# Patient Record
Sex: Female | Born: 1970 | Race: White | Hispanic: No | Marital: Married | Smoking: Former smoker
Health system: Southern US, Community
[De-identification: ages and names within clinical notes are randomized; demographics above are authoritative.]

## PROBLEM LIST (undated history)

## (undated) DIAGNOSIS — E282 Polycystic ovarian syndrome: Secondary | ICD-10-CM

## (undated) DIAGNOSIS — F32A Depression, unspecified: Secondary | ICD-10-CM

## (undated) DIAGNOSIS — O1490 Unspecified pre-eclampsia, unspecified trimester: Secondary | ICD-10-CM

## (undated) DIAGNOSIS — F329 Major depressive disorder, single episode, unspecified: Secondary | ICD-10-CM

## (undated) DIAGNOSIS — O24414 Gestational diabetes mellitus in pregnancy, insulin controlled: Secondary | ICD-10-CM

## (undated) DIAGNOSIS — Z87442 Personal history of urinary calculi: Secondary | ICD-10-CM

## (undated) DIAGNOSIS — K219 Gastro-esophageal reflux disease without esophagitis: Secondary | ICD-10-CM

## (undated) DIAGNOSIS — N189 Chronic kidney disease, unspecified: Secondary | ICD-10-CM

## (undated) DIAGNOSIS — F419 Anxiety disorder, unspecified: Secondary | ICD-10-CM

## (undated) HISTORY — PX: TUBAL LIGATION: SHX77

## (undated) HISTORY — PX: KNEE SURGERY: SHX244

## (undated) HISTORY — PX: DILATION AND CURETTAGE OF UTERUS: SHX78

## (undated) HISTORY — PX: OTHER SURGICAL HISTORY: SHX169

## (undated) HISTORY — PX: CHOLECYSTECTOMY: SHX55

---

## 1997-11-21 ENCOUNTER — Ambulatory Visit (HOSPITAL_COMMUNITY): Admission: RE | Admit: 1997-11-21 | Discharge: 1997-11-22 | Payer: Self-pay | Admitting: *Deleted

## 1998-06-27 ENCOUNTER — Emergency Department (HOSPITAL_COMMUNITY): Admission: EM | Admit: 1998-06-27 | Discharge: 1998-06-27 | Payer: Self-pay | Admitting: Emergency Medicine

## 2000-02-08 ENCOUNTER — Other Ambulatory Visit: Admission: RE | Admit: 2000-02-08 | Discharge: 2000-02-08 | Payer: Self-pay | Admitting: *Deleted

## 2000-04-11 ENCOUNTER — Other Ambulatory Visit: Admission: RE | Admit: 2000-04-11 | Discharge: 2000-04-11 | Payer: Self-pay | Admitting: *Deleted

## 2000-04-11 ENCOUNTER — Encounter (INDEPENDENT_AMBULATORY_CARE_PROVIDER_SITE_OTHER): Payer: Self-pay | Admitting: Specialist

## 2000-08-08 ENCOUNTER — Other Ambulatory Visit: Admission: RE | Admit: 2000-08-08 | Discharge: 2000-08-08 | Payer: Self-pay | Admitting: *Deleted

## 2001-02-13 ENCOUNTER — Other Ambulatory Visit: Admission: RE | Admit: 2001-02-13 | Discharge: 2001-02-13 | Payer: Self-pay | Admitting: Obstetrics and Gynecology

## 2001-07-09 ENCOUNTER — Other Ambulatory Visit: Admission: RE | Admit: 2001-07-09 | Discharge: 2001-07-09 | Payer: Self-pay | Admitting: Obstetrics and Gynecology

## 2002-01-18 ENCOUNTER — Emergency Department (HOSPITAL_COMMUNITY): Admission: EM | Admit: 2002-01-18 | Discharge: 2002-01-18 | Payer: Self-pay | Admitting: Emergency Medicine

## 2002-05-17 ENCOUNTER — Other Ambulatory Visit: Admission: RE | Admit: 2002-05-17 | Discharge: 2002-05-17 | Payer: Self-pay | Admitting: Obstetrics and Gynecology

## 2002-07-02 ENCOUNTER — Emergency Department (HOSPITAL_COMMUNITY): Admission: EM | Admit: 2002-07-02 | Discharge: 2002-07-02 | Payer: Self-pay | Admitting: Emergency Medicine

## 2002-07-02 ENCOUNTER — Encounter: Payer: Self-pay | Admitting: Emergency Medicine

## 2002-10-21 ENCOUNTER — Other Ambulatory Visit: Admission: RE | Admit: 2002-10-21 | Discharge: 2002-10-21 | Payer: Self-pay | Admitting: Obstetrics and Gynecology

## 2003-02-04 ENCOUNTER — Encounter: Payer: Self-pay | Admitting: Internal Medicine

## 2003-02-04 ENCOUNTER — Ambulatory Visit (HOSPITAL_COMMUNITY): Admission: RE | Admit: 2003-02-04 | Discharge: 2003-02-04 | Payer: Self-pay | Admitting: Internal Medicine

## 2003-06-30 ENCOUNTER — Other Ambulatory Visit: Admission: RE | Admit: 2003-06-30 | Discharge: 2003-06-30 | Payer: Self-pay | Admitting: Obstetrics and Gynecology

## 2003-11-17 ENCOUNTER — Encounter: Admission: RE | Admit: 2003-11-17 | Discharge: 2003-11-17 | Payer: Self-pay | Admitting: Obstetrics and Gynecology

## 2004-07-13 ENCOUNTER — Other Ambulatory Visit: Admission: RE | Admit: 2004-07-13 | Discharge: 2004-07-13 | Payer: Self-pay | Admitting: Obstetrics and Gynecology

## 2005-07-28 ENCOUNTER — Other Ambulatory Visit: Admission: RE | Admit: 2005-07-28 | Discharge: 2005-07-28 | Payer: Self-pay | Admitting: Obstetrics and Gynecology

## 2006-11-27 ENCOUNTER — Ambulatory Visit: Payer: Self-pay | Admitting: Internal Medicine

## 2007-03-06 ENCOUNTER — Ambulatory Visit: Payer: Self-pay | Admitting: Internal Medicine

## 2007-08-13 DIAGNOSIS — N200 Calculus of kidney: Secondary | ICD-10-CM

## 2007-08-13 DIAGNOSIS — K5900 Constipation, unspecified: Secondary | ICD-10-CM | POA: Insufficient documentation

## 2007-08-13 DIAGNOSIS — E282 Polycystic ovarian syndrome: Secondary | ICD-10-CM

## 2007-08-13 DIAGNOSIS — I10 Essential (primary) hypertension: Secondary | ICD-10-CM | POA: Insufficient documentation

## 2008-08-25 ENCOUNTER — Ambulatory Visit (HOSPITAL_COMMUNITY): Admission: RE | Admit: 2008-08-25 | Discharge: 2008-08-25 | Payer: Self-pay | Admitting: Obstetrics and Gynecology

## 2008-08-25 ENCOUNTER — Encounter (INDEPENDENT_AMBULATORY_CARE_PROVIDER_SITE_OTHER): Payer: Self-pay | Admitting: Obstetrics and Gynecology

## 2009-08-26 ENCOUNTER — Ambulatory Visit (HOSPITAL_COMMUNITY): Admission: RE | Admit: 2009-08-26 | Discharge: 2009-08-26 | Payer: Self-pay | Admitting: Obstetrics and Gynecology

## 2009-09-22 ENCOUNTER — Ambulatory Visit (HOSPITAL_COMMUNITY): Admission: RE | Admit: 2009-09-22 | Discharge: 2009-09-22 | Payer: Self-pay | Admitting: Obstetrics and Gynecology

## 2009-10-07 ENCOUNTER — Ambulatory Visit (HOSPITAL_COMMUNITY): Admission: RE | Admit: 2009-10-07 | Discharge: 2009-10-07 | Payer: Self-pay | Admitting: Obstetrics and Gynecology

## 2009-11-20 ENCOUNTER — Encounter: Admission: RE | Admit: 2009-11-20 | Discharge: 2009-11-20 | Payer: Self-pay | Admitting: Obstetrics and Gynecology

## 2010-02-23 ENCOUNTER — Inpatient Hospital Stay (HOSPITAL_COMMUNITY): Admission: AD | Admit: 2010-02-23 | Discharge: 2010-02-23 | Payer: Self-pay | Admitting: Obstetrics and Gynecology

## 2010-03-02 ENCOUNTER — Ambulatory Visit: Payer: Self-pay | Admitting: Nurse Practitioner

## 2010-03-02 ENCOUNTER — Inpatient Hospital Stay (HOSPITAL_COMMUNITY): Admission: AD | Admit: 2010-03-02 | Discharge: 2010-03-08 | Payer: Self-pay | Admitting: Obstetrics and Gynecology

## 2010-03-04 ENCOUNTER — Encounter (INDEPENDENT_AMBULATORY_CARE_PROVIDER_SITE_OTHER): Payer: Self-pay | Admitting: Obstetrics and Gynecology

## 2010-08-11 ENCOUNTER — Other Ambulatory Visit: Payer: Self-pay | Admitting: Obstetrics and Gynecology

## 2010-08-19 LAB — COMPREHENSIVE METABOLIC PANEL WITH GFR
ALT: 13 U/L (ref 0–35)
ALT: 13 U/L (ref 0–35)
ALT: 14 U/L (ref 0–35)
AST: 20 U/L (ref 0–37)
AST: 20 U/L (ref 0–37)
AST: 33 U/L (ref 0–37)
Albumin: 2.2 g/dL — ABNORMAL LOW (ref 3.5–5.2)
Albumin: 2.7 g/dL — ABNORMAL LOW (ref 3.5–5.2)
Albumin: 2.7 g/dL — ABNORMAL LOW (ref 3.5–5.2)
Alkaline Phosphatase: 106 U/L (ref 39–117)
Alkaline Phosphatase: 127 U/L — ABNORMAL HIGH (ref 39–117)
Alkaline Phosphatase: 132 U/L — ABNORMAL HIGH (ref 39–117)
BUN: 10 mg/dL (ref 6–23)
BUN: 6 mg/dL (ref 6–23)
BUN: 9 mg/dL (ref 6–23)
CO2: 20 meq/L (ref 19–32)
CO2: 21 meq/L (ref 19–32)
CO2: 23 meq/L (ref 19–32)
Calcium: 8.3 mg/dL — ABNORMAL LOW (ref 8.4–10.5)
Calcium: 9 mg/dL (ref 8.4–10.5)
Calcium: 9.2 mg/dL (ref 8.4–10.5)
Chloride: 106 meq/L (ref 96–112)
Chloride: 109 meq/L (ref 96–112)
Chloride: 109 meq/L (ref 96–112)
Creatinine, Ser: 0.62 mg/dL (ref 0.4–1.2)
Creatinine, Ser: 0.63 mg/dL (ref 0.4–1.2)
Creatinine, Ser: 0.89 mg/dL (ref 0.4–1.2)
GFR calc non Af Amer: >60 mL/min
GFR calc non Af Amer: >60 mL/min
GFR calc non Af Amer: >60 mL/min
Glucose, Bld: 109 mg/dL — ABNORMAL HIGH (ref 70–99)
Glucose, Bld: 74 mg/dL (ref 70–99)
Glucose, Bld: 82 mg/dL (ref 70–99)
Potassium: 3.7 meq/L (ref 3.5–5.1)
Potassium: 3.8 meq/L (ref 3.5–5.1)
Potassium: 4 meq/L (ref 3.5–5.1)
Sodium: 135 meq/L (ref 135–145)
Sodium: 137 meq/L (ref 135–145)
Sodium: 137 meq/L (ref 135–145)
Total Bilirubin: 0.2 mg/dL — ABNORMAL LOW (ref 0.3–1.2)
Total Bilirubin: 0.3 mg/dL (ref 0.3–1.2)
Total Bilirubin: 0.4 mg/dL (ref 0.3–1.2)
Total Protein: 4.7 g/dL — ABNORMAL LOW (ref 6.0–8.3)
Total Protein: 6.4 g/dL (ref 6.0–8.3)
Total Protein: 6.4 g/dL (ref 6.0–8.3)

## 2010-08-19 LAB — GLUCOSE, CAPILLARY
Glucose-Capillary: 103 mg/dL — ABNORMAL HIGH (ref 70–99)
Glucose-Capillary: 111 mg/dL — ABNORMAL HIGH (ref 70–99)
Glucose-Capillary: 94 mg/dL (ref 70–99)
Glucose-Capillary: 98 mg/dL (ref 70–99)

## 2010-08-19 LAB — CBC
HCT: 32.8 % — ABNORMAL LOW (ref 36.0–46.0)
Hemoglobin: 11.4 g/dL — ABNORMAL LOW (ref 12.0–15.0)
Hemoglobin: 8.4 g/dL — ABNORMAL LOW (ref 12.0–15.0)
MCH: 30.5 pg (ref 26.0–34.0)
MCHC: 34.2 g/dL (ref 30.0–36.0)
MCHC: 34.3 g/dL (ref 30.0–36.0)
MCV: 88.5 fL (ref 78.0–100.0)
MCV: 89.5 fL (ref 78.0–100.0)
MCV: 89.9 fL (ref 78.0–100.0)
Platelets: 270 10*3/uL (ref 150–400)
Platelets: 277 10*3/uL (ref 150–400)
RBC: 2.72 MIL/uL — ABNORMAL LOW (ref 3.87–5.11)
RBC: 3.71 MIL/uL — ABNORMAL LOW (ref 3.87–5.11)
RBC: 3.75 MIL/uL — ABNORMAL LOW (ref 3.87–5.11)
RDW: 15.2 % (ref 11.5–15.5)
RDW: 15.2 % (ref 11.5–15.5)
WBC: 13.1 10*3/uL — ABNORMAL HIGH (ref 4.0–10.5)
WBC: 14.6 10*3/uL — ABNORMAL HIGH (ref 4.0–10.5)
WBC: 20.8 10*3/uL — ABNORMAL HIGH (ref 4.0–10.5)

## 2010-08-19 LAB — URINE MICROSCOPIC-ADD ON

## 2010-08-19 LAB — URINALYSIS, ROUTINE W REFLEX MICROSCOPIC
Bilirubin Urine: NEGATIVE
Glucose, UA: NEGATIVE mg/dL
Ketones, ur: NEGATIVE mg/dL
Specific Gravity, Urine: 1.015 (ref 1.005–1.030)
pH: 7 (ref 5.0–8.0)

## 2010-08-19 LAB — CCBB MATERNAL DONOR DRAW

## 2010-08-19 LAB — PROTEIN, URINE, 24 HOUR: Protein, Urine: 14 mg/dL

## 2010-08-19 LAB — SYPHILIS: RPR W/REFLEX TO RPR TITER AND TREPONEMAL ANTIBODIES, TRADITIONAL SCREENING AND DIAGNOSIS ALGORITHM: RPR Ser Ql: NONREACTIVE

## 2010-08-19 LAB — CREATININE CLEARANCE, URINE, 24 HOUR: Creatinine, 24H Ur: 1528 mg/d (ref 700–1800)

## 2010-08-19 LAB — URIC ACID: Uric Acid, Serum: 4.6 mg/dL (ref 2.4–7.0)

## 2010-09-16 LAB — CBC
HCT: 43.9 % (ref 36.0–46.0)
Hemoglobin: 14.9 g/dL (ref 12.0–15.0)
MCV: 92.4 fL (ref 78.0–100.0)
RDW: 12.8 % (ref 11.5–15.5)

## 2010-10-19 NOTE — Assessment & Plan Note (Signed)
[facility]                         GASTROENTEROLOGY OFFICE NOTE   SERAPHINE, GUDIEL                       MRN:          [id number]  DATE:11/27/2006                            DOB:          [birth year]    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  She is here today because of increasing  constipation.  Her constipation started when she went on a weight  reduction diet in January of this year.  Prior to that, she had a bowel  movement every 3-4 days which she considered normal for her.  Since she  has been on a diet, she has lost about 70 pounds and has bowel movements  every 7-9 days.  Prior to having a bowel movement, she usually develops  crampy abdominal pain, alot of discomfort resulting in a lot of forceful  bowel movements ending in diarrhea.  This seems to interfere with her  work.  Her eating habits consist of high fiber, low fat and low  carbohydrate diet.  It seems very reasonable what she eats, egg whites,  bananas, protein supplements for snacks, vegetable cup for lunch and  supper.  She has been losing about 10 pounds weekly, and her goal weight  loss is down to a 175 pounds, currently being 202 pounds.  She feels  physically well, and she exercises several times a week, mostly walking,  and she also started running.  She denies any upper GI symptoms of  heartburn, nausea, vomiting.   MEDICATIONS:  1. BC birth control pills.  2. Valtrex 500 mg p.o. daily.  3. Spironolactone 100 mg daily.  4. Metformin 2000 mg daily.  5. __________  5 mg one p.o. daily.  6. Zymar eye drops.  7. Tums p.r.n.   PAST HISTORY:  1. Kidney stones.  2. High blood pressure.  3. Cholecystectomy in 1998 for cholelithiasis.  4. Arthroscopic surgery of the knee in 1997.   FAMILY HISTORY:  Negative for colon cancer or inflammatory bowel  disease.  Positive for heart disease in father and  grandparents.  Diabetes in father and grandparents.  Alcoholism is paternal  grandfather.  Ovarian cancer in maternal grandmother and breast cancer  in paternal grandmother.   SOCIAL HISTORY:  She is single.  Four years of college.  She lives with  her boyfriend and works as a Database administrator.  She does not  smoke and drinks alcohol only socially.   REVIEW OF SYSTEMS:  Positive for eyeglasses, allergies, sleeping  problems, pain with periods.   PHYSICAL EXAMINATION:  VITAL SIGNS:  Blood pressure 104/74, pulse 64,  weight 202 pounds.  GENERAL:  She was alert, oriented, healthy appearing.  LUNGS:  Clear to auscultation.  COR:  Normal S1, normal S2.  ABDOMEN:  Soft with normoactive bowel sounds.  Post cholecystectomy  laparoscopic scars.  Liver edge at costal margin.  No fullness in the  colon.  RECTAL:  Normal rectal tone.  There was no stool in the rectal vault.  Mucus was Hemoccult negative.   IMPRESSION:  A 40 year old white female with functional constipation  precipitated by a change in the diet resulting in massive weight loss  which was all intentional.  Although she thinks she eats a lot of fiber,  it is my impression that it probably does not add up to 20 gm a day.  She has already tried fiber supplements and she is reluctant to use that  as the only stimulant for her bowel movements.   PLAN:  1. High fiber diet.  Outline given to the patient to educate herself      about content of fiber in each food.  2. Stool softener, Colace, 1-2 a day.  3. MiraLax 8.5 to 17 gm daily to produce bowel movements every other      day or so.  4. I would like to see her again in 3 months to see whether we have      improved her bowel movements.  By that time, she ought to be close      to her goal weight of 175 pounds.     Hedwig Morton. Juanda Chance, MD  Electronically Signed    DMB/MedQ  DD: 11/27/2006  DT: 11/27/2006  Job #: [id number]   cc:   Georgina Quint. Plotnikov, MD

## 2010-10-19 NOTE — Assessment & Plan Note (Signed)
[facility]                         GASTROENTEROLOGY OFFICE NOTE   Emma George, Emma George                     MRN:          [id number]  DATE:03/06/2007                            DOB:          [birth year]    [name] is a very nice 40 year old white female with polycystic ovary  syndrome who takes metformin for her disease.  We have seen her in June  2008 for constipation.  This was a functional constipation related to  change in the diet and intentional weight loss.  By increasing her fiber  on a daily basis and taking Colace and Miralax, she has completely  corrected the problem with constipation.  She has been able to decrease  her Miralax to 2-3 times a week while continuing on Colace 100 mg a day.  She also added Fiberone cereal in the morning.  As far as her other GI  complaints or concerns, she has developed a lumpy feeling in the back of  her throat which comes and goes.  It is not associated with dysphagia or  odynophagia.  She has occasional heartburn.  She has not tried any acid-  suppressing agents.   PHYSICAL EXAM:  Blood pressure 118/70, pulse 72, weight 205 pounds.  She  has not lost any weight since last appointment in June.  The patient was  not reexamined today.   IMPRESSION:  87. A 40 year old white female with functional constipation currently      under good control with stool softener and high fiber diet as well      as low dose Miralax.  2. Lump in the throat.  Symptoms such as this are typical of      gastroesophageal reflux versus allergies.   PLAN:  Prilosec 20 mg daily.  Will be e-prescribing to [facility] on  [address] at [employer].  She will try it for 4-6 weeks.  If it  does not work, she may need to see a ear/nose/throat specialist to  evaluate her possible allergies or throat problems.  If the Prilosec  helps, I have her 3 refills to continue on p.r.n. basis.     Hedwig Morton. Juanda Chance, MD  Electronically  Signed    DMB/MedQ  DD: 03/06/2007  DT: 03/06/2007  Job #: [id number]   cc:   Georgina Quint. Plotnikov, MD

## 2010-10-19 NOTE — Op Note (Signed)
Emma George, Emma George             ACCOUNT NO.:  [id number]   MEDICAL RECORD NO.:  [id number]          PATIENT TYPE:  AMB   LOCATION:  SDC                           FACILITY:  [facility]   PHYSICIAN:  Randye Lobo, M.D.   DATE OF BIRTH:  [birth year]   DATE OF PROCEDURE:  08/25/2008  DATE OF DISCHARGE:                               OPERATIVE REPORT   PREOPERATIVE DIAGNOSIS:  Missed abortion.   POSTOPERATIVE DIAGNOSIS:  Missed abortion.   PROCEDURE:  Dilation and evacuation.   SURGEON:  Randye Lobo, MD   ANESTHESIA:  MAC, local with 1% lidocaine.   ESTIMATED BLOOD LOSS:  Minimal.   URINE OUTPUT:  By I and O catheterization.   COMPLICATIONS:  None.   INDICATIONS FOR PROCEDURE:  The patient is a 40 year old gravida 1, para  33 Caucasian female who presents with vaginal bleeding and spotting.  The  patient has a known diagnosis of an intrauterine gestation at  approximately [redacted] weeks gestation.  The patient has been seen for an  initial ultrasound in the office at which time she had a viable fetus  with a fetal heart rate of 95 beats per minute.  The patient developed  bleeding and spotting, and was seen in followup for repeat ultrasound  and at that time, a missed abortion was diagnosed.  The patient now  presents for dilation and evacuation after risks, benefits, and  alternatives are reviewed.   FINDINGS:  Examination under anesthesia revealed a 7-week size  anteverted mobile uterus.  No adnexal masses were appreciated.   A small amount of products of conception were obtained.   PROCEDURE:  The patient was reidentified in the preoperative hold area.  She did receive Ancef 1 g IV for antibiotic prophylaxis.   In the operating room, MAC anesthesia was induced.  The patient was  placed in the dorsal lithotomy position.  The lower abdomen, vagina, and  perineum were sterilely prepped and draped, and the bladder was  catheterized of urine with a red rubber catheter.   An  examination under anesthesia was performed.  A speculum was placed  inside the vagina and 1% lidocaine was used to inject at the 12 o'clock  position.  A single-tooth tenaculum was then applied.  The paracervical  block was then performed in standard fashion using the 1% lidocaine.   The uterus was sounded at this point to 7 cm.  The cervix was then  dilated to a #21 Pratt dilator.  A #7 suction tip curette was then  introduced through the cervical os to the level of the uterine fundus  and withdrawn slightly.  Proper suction was applied and the suction tube  was turned in a clockwise fashion as it was removed from within the  uterine cavity.  This was repeated an additional time.  Products of  conception were noted.  A sharp curettage with a serrated curette was  then performed gently in all four quadrants.  There were no remaining  products of conception appreciated.  The suction tip curette was  introduced one final time to remove any remaining blood  clots from  within the uterine cavity.   Vaginal instruments were removed as hemostasis was noted to be good.   There were no complications.  All needle, instrument, and sponge counts  were correct.      Randye Lobo, M.D.  Electronically Signed     BES/MEDQ  D:  08/25/2008  T:  08/26/2008  Job:  [id number]

## 2012-01-10 ENCOUNTER — Other Ambulatory Visit: Payer: Self-pay

## 2012-02-23 ENCOUNTER — Ambulatory Visit (HOSPITAL_COMMUNITY)
Admission: RE | Admit: 2012-02-23 | Discharge: 2012-02-23 | Disposition: A | Discharge status: Home / Self Care | Payer: 59 | Source: Ambulatory Visit | Attending: Obstetrics and Gynecology | Admitting: Obstetrics and Gynecology

## 2012-02-23 ENCOUNTER — Ambulatory Visit (HOSPITAL_COMMUNITY): Admission: RE | Admit: 2012-02-23 | Payer: 59 | Source: Ambulatory Visit

## 2012-02-23 ENCOUNTER — Encounter: Payer: 59 | Attending: Obstetrics and Gynecology | Admitting: Dietician

## 2012-02-23 ENCOUNTER — Other Ambulatory Visit: Payer: Self-pay | Admitting: Obstetrics and Gynecology

## 2012-02-23 DIAGNOSIS — Z713 Dietary counseling and surveillance: Secondary | ICD-10-CM | POA: Insufficient documentation

## 2012-02-23 DIAGNOSIS — O99810 Abnormal glucose complicating pregnancy: Secondary | ICD-10-CM | POA: Insufficient documentation

## 2012-02-23 NOTE — ED Notes (Signed)
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  Last pregnancy was 3 years ago following under going fertility treatments.  This pregnancy was not planned.  Ht: 66 in, WT: 258 lb, EDD: 08/27/2012.  Currently at 13 weeks.  Completed review of the diet and recommendations for self-care with a carbohydrate restricted diet.  She has a glucose mete and is not consistently monitoring as she has no strips.  She has an appointment to see Dr. Lucianne Muss for an endocrinologist assessment.  He has in the past ordered her strips.  Provided her with a One Touch Ultra Mini Meter; Lot [id number] X Exp 06/2012.  Instructed to check fasting and 2 hour post-meal blood glucose levels.  On return demonstration, her blood glucose 40 minutes after lucch was 156 mg.  Instructed to take meter and blood glucose log to all clinic appointments.  She is to follow-up with me if she does not see the CDE in Dr. Remus Blake office on a regular basis.  She has my card

## 2012-03-26 ENCOUNTER — Other Ambulatory Visit (HOSPITAL_COMMUNITY): Payer: Self-pay | Admitting: Obstetrics and Gynecology

## 2012-03-26 ENCOUNTER — Ambulatory Visit (HOSPITAL_COMMUNITY)
Admission: RE | Admit: 2012-03-26 | Discharge: 2012-03-26 | Disposition: A | Discharge status: Home / Self Care | Payer: 59 | Source: Ambulatory Visit | Attending: Obstetrics and Gynecology | Admitting: Obstetrics and Gynecology

## 2012-03-26 ENCOUNTER — Encounter (HOSPITAL_COMMUNITY): Payer: Self-pay

## 2012-03-26 VITALS — BP 125/66 | Temp 81.0°F | Wt 253.5 lb

## 2012-03-26 DIAGNOSIS — O24419 Gestational diabetes mellitus in pregnancy, unspecified control: Secondary | ICD-10-CM

## 2012-03-26 DIAGNOSIS — Z1389 Encounter for screening for other disorder: Secondary | ICD-10-CM | POA: Insufficient documentation

## 2012-03-26 DIAGNOSIS — O358XX0 Maternal care for other (suspected) fetal abnormality and damage, not applicable or unspecified: Secondary | ICD-10-CM | POA: Insufficient documentation

## 2012-03-26 DIAGNOSIS — Z363 Encounter for antenatal screening for malformations: Secondary | ICD-10-CM | POA: Insufficient documentation

## 2012-03-26 DIAGNOSIS — O3410 Maternal care for benign tumor of corpus uteri, unspecified trimester: Secondary | ICD-10-CM | POA: Insufficient documentation

## 2012-03-26 DIAGNOSIS — O24919 Unspecified diabetes mellitus in pregnancy, unspecified trimester: Secondary | ICD-10-CM | POA: Insufficient documentation

## 2012-03-26 HISTORY — DX: Polycystic ovarian syndrome: E28.2

## 2012-03-26 NOTE — Progress Notes (Signed)
Emma George  was seen today for an ultrasound appointment.  See full report in [facility].  Alpha Gula, MD  Single IUP at 18 0/7 weeks Pre-gestational diabetes not on medications currently Normal detailed fetal anatomy; however, somewhat limited views of the spine were obtained due to fetal position No markers associated with aneuploidy noted Normal amniotic fluid volume  Recommend follow up ultrasound in 4 weeks to reevaluate the fetal spine / growth.

## 2012-04-23 ENCOUNTER — Ambulatory Visit (HOSPITAL_COMMUNITY)
Admission: RE | Admit: 2012-04-23 | Discharge: 2012-04-23 | Disposition: A | Discharge status: Home / Self Care | Payer: 59 | Source: Ambulatory Visit | Attending: Obstetrics and Gynecology | Admitting: Obstetrics and Gynecology

## 2012-04-23 VITALS — BP 133/73 | HR 92 | Wt 257.0 lb

## 2012-04-23 DIAGNOSIS — I10 Essential (primary) hypertension: Secondary | ICD-10-CM

## 2012-04-23 DIAGNOSIS — O24919 Unspecified diabetes mellitus in pregnancy, unspecified trimester: Secondary | ICD-10-CM | POA: Insufficient documentation

## 2012-04-23 DIAGNOSIS — O24419 Gestational diabetes mellitus in pregnancy, unspecified control: Secondary | ICD-10-CM

## 2012-04-23 DIAGNOSIS — Z3689 Encounter for other specified antenatal screening: Secondary | ICD-10-CM | POA: Insufficient documentation

## 2012-04-23 DIAGNOSIS — O3410 Maternal care for benign tumor of corpus uteri, unspecified trimester: Secondary | ICD-10-CM | POA: Insufficient documentation

## 2012-04-23 NOTE — Progress Notes (Signed)
Emma George  was seen today for an ultrasound appointment.  See full report in [facility].  Single IUP at 22 0/7 weeks Pre-gestational diabetes - recently started on glyburide Normal interval anatomy - the heart and spine were well visualized on today's study Interval fetal growth is appropriate (56th %tile) Normal amniotic fluid volume  Will schedule fetal echo due to pregestational diabetes Follow up growth scan in 6 weeks  Alpha Gula, MD

## 2012-05-02 ENCOUNTER — Encounter: Payer: Self-pay | Admitting: Obstetrics and Gynecology

## 2012-06-04 ENCOUNTER — Ambulatory Visit (HOSPITAL_COMMUNITY)
Admission: RE | Admit: 2012-06-04 | Discharge: 2012-06-04 | Disposition: A | Discharge status: Home / Self Care | Payer: 59 | Source: Ambulatory Visit | Attending: Obstetrics and Gynecology | Admitting: Obstetrics and Gynecology

## 2012-06-04 VITALS — BP 126/73 | HR 98 | Wt 261.0 lb

## 2012-06-04 DIAGNOSIS — E669 Obesity, unspecified: Secondary | ICD-10-CM | POA: Insufficient documentation

## 2012-06-04 DIAGNOSIS — O3410 Maternal care for benign tumor of corpus uteri, unspecified trimester: Secondary | ICD-10-CM | POA: Insufficient documentation

## 2012-06-04 DIAGNOSIS — O24919 Unspecified diabetes mellitus in pregnancy, unspecified trimester: Secondary | ICD-10-CM | POA: Insufficient documentation

## 2012-06-04 DIAGNOSIS — O99210 Obesity complicating pregnancy, unspecified trimester: Secondary | ICD-10-CM | POA: Insufficient documentation

## 2012-06-04 DIAGNOSIS — I10 Essential (primary) hypertension: Secondary | ICD-10-CM

## 2012-06-04 DIAGNOSIS — O24419 Gestational diabetes mellitus in pregnancy, unspecified control: Secondary | ICD-10-CM

## 2012-06-04 NOTE — Progress Notes (Signed)
Emma George  was seen today for an ultrasound appointment.  See full report in [facility].  Impression: Single IUP at 28 0/7 weeks Pregestational diabetes on Gylburide, normal fetal echo by Peds cardiology Normal interval anatomy The estimated fetal weight today is at the 84th %tile; [facility] measures > 97th %tile Normal amniotic fluid volume  Recommendations: Recommend follow up ultrasound for fetal growth in 4 weeks Antepartum fetal testing (2x weekly NSTs) beginning at 32 weeks.  Alpha Gula, MD

## 2012-06-11 ENCOUNTER — Encounter: Payer: 59 | Attending: Obstetrics and Gynecology | Admitting: Dietician

## 2012-06-11 ENCOUNTER — Encounter: Payer: Self-pay | Admitting: Dietician

## 2012-06-11 VITALS — Ht 66.0 in | Wt 262.3 lb

## 2012-06-11 DIAGNOSIS — O24419 Gestational diabetes mellitus in pregnancy, unspecified control: Secondary | ICD-10-CM

## 2012-06-11 DIAGNOSIS — O99810 Abnormal glucose complicating pregnancy: Secondary | ICD-10-CM | POA: Insufficient documentation

## 2012-06-11 DIAGNOSIS — Z713 Dietary counseling and surveillance: Secondary | ICD-10-CM | POA: Insufficient documentation

## 2012-06-11 NOTE — Progress Notes (Signed)
  Medical Nutrition Therapy:  Appt start time: 1400 end time:  1430.   Assessment:  Primary concerns today: Needs insulin instruction.  Presents with history of GDM with her second pregnancy.  Has been on Glyburide 2.5 in the AM and 5 mg AC dinner.  Currently her post meal blood glucose levels are continuing to increase even with close carbohydrate counting.  Post breakfast today was at 155 mg at 2 hours after first bite of breakfast.  Currently monitoring fasting and 2 hr post-meal blood glucose levels. Will teach mixing insulins as she is currently at about 30 weeks in the pregnancy with the potential for even higher glucose levels with the increase in hormones.     MEDICATIONS: Prenatal vitamins, NPH insulin: 10 units at HS   Usual physical activity: Getting in some walking, "but not as much as I should."  Progress Towards Goal(s):  In progress.   Nutritional Diagnosis:  [place]-2.1 Inpaired nutrition utilization As related to blood glucose levels.  As evidenced by diagnosis of GDM, with increasing fasting and 2 hr post meal blood glucose levels..    Intervention:  Nutrition : Completed review of glucose numbers and brief dietary intake.  Provided insulin start kit.  Lot: [id number] EXP: 2016/09/08.  Review of the process of drawing up and mixing NPH and Regular insulin for possible use in the future.  On return demonstration, completed a self-injection using sterile saline.    Handouts given during visit include:  BD Insulin Start Kit.  Monitoring/Evaluation:  Dietary intake, exercise, blood glucose levels, and body weight as neeeded.  She is to call with questions or for other assistance.Marland Kitchen

## 2012-07-04 ENCOUNTER — Ambulatory Visit (HOSPITAL_COMMUNITY)
Admission: RE | Admit: 2012-07-04 | Discharge: 2012-07-04 | Disposition: A | Discharge status: Home / Self Care | Payer: 59 | Source: Ambulatory Visit | Attending: Obstetrics and Gynecology | Admitting: Obstetrics and Gynecology

## 2012-07-04 ENCOUNTER — Encounter (HOSPITAL_COMMUNITY): Payer: Self-pay

## 2012-07-04 VITALS — BP 124/73 | HR 90 | Wt 270.0 lb

## 2012-07-04 DIAGNOSIS — O24919 Unspecified diabetes mellitus in pregnancy, unspecified trimester: Secondary | ICD-10-CM | POA: Insufficient documentation

## 2012-07-04 DIAGNOSIS — O24419 Gestational diabetes mellitus in pregnancy, unspecified control: Secondary | ICD-10-CM

## 2012-07-04 DIAGNOSIS — I10 Essential (primary) hypertension: Secondary | ICD-10-CM

## 2012-07-04 DIAGNOSIS — E669 Obesity, unspecified: Secondary | ICD-10-CM | POA: Insufficient documentation

## 2012-07-04 DIAGNOSIS — O3410 Maternal care for benign tumor of corpus uteri, unspecified trimester: Secondary | ICD-10-CM | POA: Insufficient documentation

## 2012-07-04 NOTE — Progress Notes (Signed)
Emma George  was seen today for an ultrasound appointment.  See full report in [facility].  Impression: Single IUP at 34 1/7 weeks Pregestational diabetes on Gylburide, normal fetal echo by Peds cardiology Normal interval anatomy The estimated fetal weight today is at the 86th %tile; [facility] measures > 97th %tile Normal amniotic fluid volume  Recommendations: Recommend follow up ultrasound for fetal growth in 3- 4 weeks Continue antepartum fetal testing (2x weekly NSTs)  Alpha Gula, MD

## 2012-08-02 ENCOUNTER — Ambulatory Visit (HOSPITAL_COMMUNITY)
Admission: RE | Admit: 2012-08-02 | Discharge: 2012-08-02 | Disposition: A | Discharge status: Home / Self Care | Payer: 59 | Source: Ambulatory Visit | Attending: Obstetrics & Gynecology | Admitting: Obstetrics & Gynecology

## 2012-08-02 ENCOUNTER — Ambulatory Visit (HOSPITAL_COMMUNITY)
Admission: RE | Admit: 2012-08-02 | Discharge: 2012-08-02 | Disposition: A | Discharge status: Home / Self Care | Payer: 59 | Source: Ambulatory Visit | Attending: Obstetrics and Gynecology | Admitting: Obstetrics and Gynecology

## 2012-08-02 DIAGNOSIS — O3410 Maternal care for benign tumor of corpus uteri, unspecified trimester: Secondary | ICD-10-CM | POA: Insufficient documentation

## 2012-08-02 DIAGNOSIS — I10 Essential (primary) hypertension: Secondary | ICD-10-CM

## 2012-08-02 DIAGNOSIS — O99210 Obesity complicating pregnancy, unspecified trimester: Secondary | ICD-10-CM | POA: Insufficient documentation

## 2012-08-02 DIAGNOSIS — O24919 Unspecified diabetes mellitus in pregnancy, unspecified trimester: Secondary | ICD-10-CM | POA: Insufficient documentation

## 2012-08-02 DIAGNOSIS — E669 Obesity, unspecified: Secondary | ICD-10-CM | POA: Insufficient documentation

## 2012-08-02 DIAGNOSIS — O24419 Gestational diabetes mellitus in pregnancy, unspecified control: Secondary | ICD-10-CM

## 2012-08-07 ENCOUNTER — Encounter (HOSPITAL_COMMUNITY): Payer: Self-pay | Admitting: Pharmacist

## 2012-08-09 ENCOUNTER — Inpatient Hospital Stay (HOSPITAL_COMMUNITY)
Admission: AD | Admit: 2012-08-09 | Discharge: 2012-08-09 | Disposition: A | Discharge status: Home / Self Care | Payer: 59 | Source: Ambulatory Visit | Attending: Obstetrics and Gynecology | Admitting: Obstetrics and Gynecology

## 2012-08-09 ENCOUNTER — Encounter (HOSPITAL_COMMUNITY): Payer: Self-pay

## 2012-08-09 DIAGNOSIS — O139 Gestational [pregnancy-induced] hypertension without significant proteinuria, unspecified trimester: Secondary | ICD-10-CM

## 2012-08-09 DIAGNOSIS — O133 Gestational [pregnancy-induced] hypertension without significant proteinuria, third trimester: Secondary | ICD-10-CM

## 2012-08-09 HISTORY — DX: Gestational diabetes mellitus in pregnancy, insulin controlled: O24.414

## 2012-08-09 LAB — COMPREHENSIVE METABOLIC PANEL
BUN: 10 mg/dL (ref 6–23)
CO2: 22 mEq/L (ref 19–32)
Chloride: 104 mEq/L (ref 96–112)
Creatinine, Ser: 0.82 mg/dL (ref 0.50–1.10)
GFR calc non Af Amer: 89 mL/min — ABNORMAL LOW (ref >90)
Total Bilirubin: 0.2 mg/dL — ABNORMAL LOW (ref 0.3–1.2)

## 2012-08-09 LAB — LACTATE DEHYDROGENASE: LDH: 115 U/L (ref 94–250)

## 2012-08-09 LAB — CBC
HCT: 33.7 % — ABNORMAL LOW (ref 36.0–46.0)
MCV: 86.6 fL (ref 78.0–100.0)
RBC: 3.89 MIL/uL (ref 3.87–5.11)
RDW: 13.8 % (ref 11.5–15.5)
WBC: 10.7 10*3/uL — ABNORMAL HIGH (ref 4.0–10.5)

## 2012-08-09 LAB — URINE MICROSCOPIC-ADD ON

## 2012-08-09 LAB — URINALYSIS, ROUTINE W REFLEX MICROSCOPIC
Glucose, UA: NEGATIVE mg/dL
Protein, ur: 30 mg/dL — AB

## 2012-08-09 LAB — URIC ACID: Uric Acid, Serum: 5.3 mg/dL (ref 2.4–7.0)

## 2012-08-09 NOTE — MAU Provider Note (Signed)
History     CSN: [id number]  Arrival date and time: 08/09/12 1127   First Provider Initiated Contact with Patient 08/09/12 1218      Chief Complaint  Patient presents with  . Hypertension   HPI This is a 42 y.o. female at [redacted]w[redacted]d who was sent from the office for [facility] evaluation. She had elevated Bp in office with trace protein.  RN Note: Patient is sent from the office due to elevated bp and trace of protein in urine. Patient denies any pain, headache, visual disturbance, epigastric pain, contractions, vaginal bleeding or lof. Patient reports good fetal movement. Patient have 2-3+ bilateral lower extremities edema, 2+ reflex and 1 beat of clonus on right foot.       OB History   Grav Para Term Preterm Abortions TAB SAB Ect Mult Living   3 1 1  0 1 0 1 0 0 1      Past Medical History  Diagnosis Date  . PCOS (polycystic ovarian syndrome)   . Gestational diabetes requiring insulin     Past Surgical History  Procedure Laterality Date  . Cesarean section      History reviewed. No pertinent family history.  History  Substance Use Topics  . Smoking status: Never Smoker   . Smokeless tobacco: Not on file  . Alcohol Use: No    Allergies:  Allergies  Allergen Reactions  . Codeine     Skin crawling    Prescriptions prior to admission  Medication Sig Dispense Refill  . Dihydroxyaluminum Sod Carb (ROLAIDS PO) Take 15 mLs by mouth 2 (two) times daily as needed (for heartburn).      . Prenatal Vit-Fe Fumarate-FA (PRENATAL MULTIVITAMIN) TABS Take 1 tablet by mouth at bedtime.      . insulin NPH (HUMULIN N,NOVOLIN N) 100 UNIT/ML injection Inject 12-20 Units into the skin 2 (two) times daily. 12 units in the morning and 20 units at night      . ONE TOUCH ULTRA TEST test strip         Review of Systems  Constitutional: Negative for fever, chills and malaise/fatigue.  Eyes: Negative for blurred vision and double vision.  Respiratory: Negative for cough.   Cardiovascular:  Negative for chest pain.  Gastrointestinal: Negative for nausea, vomiting, abdominal pain, diarrhea and constipation.  Genitourinary: Negative for dysuria.  Neurological: Negative for dizziness, sensory change, focal weakness, weakness and headaches.   Physical Exam   Blood pressure 138/75, pulse 68, temperature 97.4 F (36.3 C), temperature source Oral, resp. rate 18. Filed Vitals:   08/09/12 1306 08/09/12 1321 08/09/12 1336 08/09/12 1351  BP: 138/75 126/67 140/75 141/77  Pulse: 68 58 58 63  Temp:      TempSrc:      Resp:    18    Physical Exam  Constitutional: She is oriented to person, place, and time. She appears well-developed and well-nourished. No distress.  HENT:  Head: Normocephalic.  Cardiovascular: Normal rate.   Respiratory: Effort normal.  GI: Soft. She exhibits no distension. There is no tenderness. There is no rebound.  Musculoskeletal: Normal range of motion. She exhibits edema (trace to 1+).  Neurological: She is alert and oriented to person, place, and time. She has normal reflexes.  Skin: Skin is warm and dry.  Psychiatric: She has a normal mood and affect.    MAU Course  Procedures  MDM Per Dr Henderson Cloud, discharge home. Needs BP check tomorrow afternoon.  If <160/100, may wait until scheduled C/S  Assessment and Plan  A:  SIUP at [redacted]w[redacted]d       Labile gestational hypertension  P:  Discharge home as above      Strict PIH precautions reviewed  [facility] 08/09/2012, 2:20 PM

## 2012-08-09 NOTE — Progress Notes (Signed)
Hilda Lias williams cnm notified of patient, bp reading, hyperreflexes.

## 2012-08-09 NOTE — MAU Note (Signed)
Patient is sent from the office due to elevated bp and trace of protein in urine. Patient denies any pain, headache, visual disturbance, epigastric pain, contractions, vaginal bleeding or lof. Patient reports good fetal movement. Patient have 2-3+ bilateral lower extremities edema, 2+ reflex and 1 beat of clonus on right foot.

## 2012-08-13 ENCOUNTER — Inpatient Hospital Stay (HOSPITAL_COMMUNITY)
Admission: AD | Admit: 2012-08-13 | Discharge: 2012-08-16 | DRG: 765 | Disposition: A | Discharge status: Home / Self Care | Payer: 59 | Source: Ambulatory Visit | Attending: Obstetrics and Gynecology | Admitting: Obstetrics and Gynecology

## 2012-08-13 ENCOUNTER — Encounter (HOSPITAL_COMMUNITY): Payer: Self-pay | Admitting: Anesthesiology

## 2012-08-13 ENCOUNTER — Encounter (HOSPITAL_COMMUNITY)
Admission: AD | Disposition: A | Discharge status: Home / Self Care | Payer: Self-pay | Source: Ambulatory Visit | Attending: Obstetrics and Gynecology

## 2012-08-13 ENCOUNTER — Encounter (HOSPITAL_COMMUNITY): Payer: Self-pay | Admitting: *Deleted

## 2012-08-13 ENCOUNTER — Inpatient Hospital Stay (HOSPITAL_COMMUNITY): Payer: 59 | Admitting: Anesthesiology

## 2012-08-13 DIAGNOSIS — O34219 Maternal care for unspecified type scar from previous cesarean delivery: Secondary | ICD-10-CM | POA: Diagnosis present

## 2012-08-13 DIAGNOSIS — Z302 Encounter for sterilization: Secondary | ICD-10-CM

## 2012-08-13 DIAGNOSIS — O99814 Abnormal glucose complicating childbirth: Principal | ICD-10-CM | POA: Diagnosis present

## 2012-08-13 DIAGNOSIS — O09529 Supervision of elderly multigravida, unspecified trimester: Secondary | ICD-10-CM | POA: Diagnosis present

## 2012-08-13 DIAGNOSIS — O10913 Unspecified pre-existing hypertension complicating pregnancy, third trimester: Secondary | ICD-10-CM

## 2012-08-13 DIAGNOSIS — O139 Gestational [pregnancy-induced] hypertension without significant proteinuria, unspecified trimester: Secondary | ICD-10-CM | POA: Diagnosis present

## 2012-08-13 DIAGNOSIS — E669 Obesity, unspecified: Secondary | ICD-10-CM | POA: Diagnosis present

## 2012-08-13 HISTORY — DX: Chronic kidney disease, unspecified: N18.9

## 2012-08-13 HISTORY — DX: Gastro-esophageal reflux disease without esophagitis: K21.9

## 2012-08-13 LAB — CBC
MCH: 28.4 pg (ref 26.0–34.0)
MCHC: 32.9 g/dL (ref 30.0–36.0)
Platelets: 224 10*3/uL (ref 150–400)
RBC: 4.05 MIL/uL (ref 3.87–5.11)

## 2012-08-13 LAB — COMPREHENSIVE METABOLIC PANEL
ALT: 10 U/L (ref 0–35)
AST: 20 U/L (ref 0–37)
CO2: 23 mEq/L (ref 19–32)
Calcium: 8.7 mg/dL (ref 8.4–10.5)
Sodium: 135 mEq/L (ref 135–145)
Total Protein: 6.5 g/dL (ref 6.0–8.3)

## 2012-08-13 LAB — GLUCOSE, CAPILLARY

## 2012-08-13 LAB — PREPARE RBC (CROSSMATCH)

## 2012-08-13 SURGERY — Surgical Case
Anesthesia: Spinal | Site: Abdomen | Laterality: Bilateral | Wound class: Clean Contaminated

## 2012-08-13 MED ORDER — FAMOTIDINE IN NACL 20-0.9 MG/50ML-% IV SOLN
20.0000 mg | Freq: Once | INTRAVENOUS | Status: AC
  Administered 2012-08-13: 20 mg via INTRAVENOUS
  Filled 2012-08-13: qty 50

## 2012-08-13 MED ORDER — MEPERIDINE HCL 25 MG/ML IJ SOLN: 6.2500 mg | INTRAMUSCULAR | Status: DC | PRN

## 2012-08-13 MED ORDER — SCOPOLAMINE 1 MG/3DAYS TD PT72
1.0000 | MEDICATED_PATCH | Freq: Once | TRANSDERMAL | Status: DC
  Administered 2012-08-14: 1.5 mg via TRANSDERMAL

## 2012-08-13 MED ORDER — SODIUM CHLORIDE 0.9 % IR SOLN
Status: DC | PRN
  Administered 2012-08-13: 1000 mL

## 2012-08-13 MED ORDER — LACTATED RINGERS IV SOLN
INTRAVENOUS | Status: DC
  Administered 2012-08-13: 18:00:00 via INTRAVENOUS

## 2012-08-13 MED ORDER — LACTATED RINGERS IV SOLN
INTRAVENOUS | Status: DC | PRN
  Administered 2012-08-13 (×2): via INTRAVENOUS

## 2012-08-13 MED ORDER — CEFAZOLIN SODIUM 10 G IJ SOLR
3.0000 g | INTRAMUSCULAR | Status: AC
  Administered 2012-08-13: 3 g via INTRAVENOUS
  Filled 2012-08-13: qty 3000

## 2012-08-13 MED ORDER — KETOROLAC TROMETHAMINE 30 MG/ML IJ SOLN
30.0000 mg | Freq: Four times a day (QID) | INTRAMUSCULAR | Status: AC | PRN
  Administered 2012-08-14 (×4): 30 mg via INTRAVENOUS
  Filled 2012-08-13 (×3): qty 1

## 2012-08-13 MED ORDER — BUPIVACAINE HCL (PF) 0.25 % IJ SOLN
INTRAMUSCULAR | Status: DC | PRN
  Administered 2012-08-13: 10 mL

## 2012-08-13 MED ORDER — FENTANYL CITRATE 0.05 MG/ML IJ SOLN
INTRAMUSCULAR | Status: AC
  Filled 2012-08-13: qty 2

## 2012-08-13 MED ORDER — MORPHINE SULFATE 0.5 MG/ML IJ SOLN
INTRAMUSCULAR | Status: AC
  Filled 2012-08-13: qty 10

## 2012-08-13 MED ORDER — MORPHINE SULFATE (PF) 0.5 MG/ML IJ SOLN
INTRAMUSCULAR | Status: DC | PRN
  Administered 2012-08-13: .1 mg via INTRATHECAL

## 2012-08-13 MED ORDER — FENTANYL CITRATE 0.05 MG/ML IJ SOLN
INTRAMUSCULAR | Status: DC | PRN
  Administered 2012-08-13: 25 ug via INTRATHECAL
  Administered 2012-08-13: 50 ug via INTRAVENOUS

## 2012-08-13 MED ORDER — CHLOROPROCAINE HCL 3 % IJ SOLN
INTRAMUSCULAR | Status: AC
  Filled 2012-08-13: qty 20

## 2012-08-13 MED ORDER — OXYTOCIN 10 UNIT/ML IJ SOLN
INTRAMUSCULAR | Status: AC
  Filled 2012-08-13: qty 4

## 2012-08-13 MED ORDER — MEPERIDINE HCL 25 MG/ML IJ SOLN
INTRAMUSCULAR | Status: DC | PRN
  Administered 2012-08-13: 12.5 mg via INTRAVENOUS

## 2012-08-13 MED ORDER — ONDANSETRON HCL 4 MG/2ML IJ SOLN
INTRAMUSCULAR | Status: AC
  Filled 2012-08-13: qty 2

## 2012-08-13 MED ORDER — MEPERIDINE HCL 25 MG/ML IJ SOLN
INTRAMUSCULAR | Status: AC
  Filled 2012-08-13: qty 1

## 2012-08-13 MED ORDER — PROMETHAZINE HCL 25 MG/ML IJ SOLN: 6.2500 mg | INTRAMUSCULAR | Status: DC | PRN

## 2012-08-13 MED ORDER — ONDANSETRON HCL 4 MG/2ML IJ SOLN
INTRAMUSCULAR | Status: DC | PRN
  Administered 2012-08-13: 4 mg via INTRAVENOUS

## 2012-08-13 MED ORDER — PHENYLEPHRINE 40 MCG/ML (10ML) SYRINGE FOR IV PUSH (FOR BLOOD PRESSURE SUPPORT)
PREFILLED_SYRINGE | INTRAVENOUS | Status: AC
  Filled 2012-08-13: qty 5

## 2012-08-13 MED ORDER — CITRIC ACID-SODIUM CITRATE 334-500 MG/5ML PO SOLN
30.0000 mL | Freq: Once | ORAL | Status: AC
  Administered 2012-08-13: 30 mL via ORAL
  Filled 2012-08-13: qty 15

## 2012-08-13 MED ORDER — BUPIVACAINE HCL (PF) 0.25 % IJ SOLN
INTRAMUSCULAR | Status: AC
  Filled 2012-08-13: qty 30

## 2012-08-13 MED ORDER — PHENYLEPHRINE HCL 10 MG/ML IJ SOLN
INTRAMUSCULAR | Status: DC | PRN
  Administered 2012-08-13: 80 ug via INTRAVENOUS

## 2012-08-13 MED ORDER — KETOROLAC TROMETHAMINE 30 MG/ML IJ SOLN: 30.0000 mg | Freq: Four times a day (QID) | INTRAMUSCULAR | Status: AC | PRN

## 2012-08-13 MED ORDER — LACTATED RINGERS IV SOLN: INTRAVENOUS | Status: DC

## 2012-08-13 MED ORDER — FENTANYL CITRATE 0.05 MG/ML IJ SOLN
25.0000 ug | INTRAMUSCULAR | Status: DC | PRN
  Administered 2012-08-14: 50 ug via INTRAVENOUS

## 2012-08-13 MED ORDER — MIDAZOLAM HCL 2 MG/2ML IJ SOLN: 0.5000 mg | Freq: Once | INTRAMUSCULAR | Status: AC | PRN

## 2012-08-13 MED ORDER — BUPIVACAINE IN DEXTROSE 0.75-8.25 % IT SOLN
INTRATHECAL | Status: DC | PRN
  Administered 2012-08-13: 1.6 mL via INTRATHECAL

## 2012-08-13 SURGICAL SUPPLY — 33 items
CLIP FILSHIE TUBAL LIGA STRL (Clip) ×2 IMPLANT
CLOTH BEACON ORANGE TIMEOUT ST (SAFETY) ×2 IMPLANT
DERMABOND ADHESIVE PROPEN (GAUZE/BANDAGES/DRESSINGS) ×1
DERMABOND ADVANCED (GAUZE/BANDAGES/DRESSINGS) ×1
DERMABOND ADVANCED .7 DNX12 (GAUZE/BANDAGES/DRESSINGS) ×1 IMPLANT
DERMABOND ADVANCED .7 DNX6 (GAUZE/BANDAGES/DRESSINGS) ×1 IMPLANT
DRAPE LG THREE QUARTER DISP (DRAPES) ×2 IMPLANT
DRSG OPSITE POSTOP 4X10 (GAUZE/BANDAGES/DRESSINGS) ×2 IMPLANT
DURAPREP 26ML APPLICATOR (WOUND CARE) ×2 IMPLANT
ELECT REM PT RETURN 9FT ADLT (ELECTROSURGICAL) ×2
ELECTRODE REM PT RTRN 9FT ADLT (ELECTROSURGICAL) ×1 IMPLANT
EXTRACTOR VACUUM M CUP 4 TUBE (SUCTIONS) IMPLANT
GLOVE BIO SURGEON STRL SZ7 (GLOVE) ×2 IMPLANT
GOWN STRL REIN XL XLG (GOWN DISPOSABLE) ×4 IMPLANT
KIT ABG SYR 3ML LUER SLIP (SYRINGE) IMPLANT
NEEDLE HYPO 25X5/8 SAFETYGLIDE (NEEDLE) IMPLANT
NS IRRIG 1000ML POUR BTL (IV SOLUTION) ×2 IMPLANT
PACK C SECTION WH (CUSTOM PROCEDURE TRAY) ×2 IMPLANT
PAD ABD 7.5X8 STRL (GAUZE/BANDAGES/DRESSINGS) ×2 IMPLANT
PAD OB MATERNITY 4.3X12.25 (PERSONAL CARE ITEMS) ×2 IMPLANT
RTRCTR C-SECT PINK 25CM LRG (MISCELLANEOUS) ×2 IMPLANT
SLEEVE SCD COMPRESS KNEE MED (MISCELLANEOUS) ×2 IMPLANT
STAPLER VISISTAT 35W (STAPLE) IMPLANT
SUT CHROMIC 1 CTX 36 (SUTURE) ×4 IMPLANT
SUT PDS AB 0 CTX 60 (SUTURE) ×2 IMPLANT
SUT PLAIN 2 0 XLH (SUTURE) ×2 IMPLANT
SUT VIC AB 2-0 CT1 27 (SUTURE) ×1
SUT VIC AB 2-0 CT1 TAPERPNT 27 (SUTURE) ×1 IMPLANT
SUT VIC AB 4-0 KS 27 (SUTURE) ×2 IMPLANT
TAPE CLOTH SURG 4X10 WHT LF (GAUZE/BANDAGES/DRESSINGS) ×2 IMPLANT
TOWEL OR 17X24 6PK STRL BLUE (TOWEL DISPOSABLE) ×6 IMPLANT
TRAY FOLEY CATH 14FR (SET/KITS/TRAYS/PACK) ×2 IMPLANT
WATER STERILE IRR 1000ML POUR (IV SOLUTION) ×2 IMPLANT

## 2012-08-13 NOTE — Anesthesia Procedure Notes (Signed)
Spinal  Patient location during procedure: OR Start time: 08/13/2012 10:13 PM Staffing Anesthesiologist: Angus Seller., Harrell Gave. Performed by: anesthesiologist  Preanesthetic Checklist Completed: patient identified, site marked, surgical consent, pre-op evaluation, timeout performed, IV checked, risks and benefits discussed and monitors and equipment checked Spinal Block Patient position: sitting Prep: DuraPrep Patient monitoring: heart rate, cardiac monitor, continuous pulse ox and blood pressure Approach: midline Location: L3-4 Injection technique: single-shot Needle Needle type: Sprotte  Needle gauge: 24 G Needle length: 9 cm Assessment Sensory level: T4 Additional Notes Patient identified.  Risk benefits discussed including failed block, incomplete pain control, headache, nerve damage, paralysis, blood pressure changes, nausea, vomiting, reactions to medication both toxic or allergic, and postpartum back pain.  Patient expressed understanding and wished to proceed.  All questions were answered.  Sterile technique used throughout procedure.  CSF was clear.  No parasthesia or other complications.  Please see nursing notes for vital signs.

## 2012-08-13 NOTE — MAU Provider Note (Signed)
Chief Complaint:  Hypertension   First Maxfield Gildersleeve Initiated Contact with Patient 08/13/12 1822      HPI: Emma George is a 42 y.o. G3P1011 at 21w0dwho presents to maternity admissions sent from office with elevated BP, proteinuria, and h/a.  She reports onset of h/a described as "like a vice wrapped around my head" yesterday, worsening today.  She has not taken anything for her h/a.  She denies epigastric pain or visual disturbances. She reports good fetal movement, denies LOF, vaginal bleeding, vaginal itching/burning, urinary symptoms, dizziness, n/v, or fever/chills.    Her obstetric history is significant for previous C/S and she desires a repeat C/S for this pregnancy along with BTL.  She is also GDM requiring insulin.   Past Medical History: Past Medical History  Diagnosis Date  . PCOS (polycystic ovarian syndrome)   . Gestational diabetes requiring insulin   . GERD (gastroesophageal reflux disease)   . Chronic kidney disease     Past obstetric history: OB History   Grav Para Term Preterm Abortions TAB SAB Ect Mult Living   3 1 1  0 1 0 1 0 0 1     # Outc Date GA Lbr Len/2nd Wgt Sex Del Anes PTL Lv   1 TRM            2 SAB            3 CUR               Past Surgical History: Past Surgical History  Procedure Laterality Date  . Cesarean section    . Dilation and curettage of uterus    . Cholecysectomy    . Knee surgery      Family History: Family History  Problem Relation Age of Onset  . Hypertension Mother   . Diabetes Father   . Heart disease Father   . Hyperlipidemia Father   . Cancer Maternal Grandmother   . Stroke Maternal Grandfather   . Diabetes Paternal Grandmother   . Heart disease Paternal Grandmother   . Cancer Paternal Grandmother   . Diabetes Paternal Grandfather   . Heart disease Paternal Grandfather     Social History: History  Substance Use Topics  . Smoking status: Former Smoker -- 1.00 packs/day for 15 years    Types: Cigarettes   Quit date: 09/17/2003  . Smokeless tobacco: Not on file  . Alcohol Use: No    Allergies:  Allergies  Allergen Reactions  . Codeine     Skin crawling    Meds:  Prescriptions prior to admission  Medication Sig Dispense Refill  . Dihydroxyaluminum Sod Carb (ROLAIDS PO) Take 15 mLs by mouth 2 (two) times daily as needed (for heartburn).      . insulin NPH (HUMULIN N,NOVOLIN N) 100 UNIT/ML injection Inject 12-20 Units into the skin 2 (two) times daily. 12 units in the morning and 20 units at night      . Prenatal Vit-Fe Fumarate-FA (PRENATAL MULTIVITAMIN) TABS Take 1 tablet by mouth at bedtime.      . ONE TOUCH ULTRA TEST test strip         ROS: Pertinent findings in history of present illness.  Physical Exam  Blood pressure 160/83, pulse 76, temperature 97.7 F (36.5 C), temperature source Oral, resp. rate 16, height 5' 5.5" (1.664 m), weight 128.187 kg (282 lb 9.6 oz), SpO2 99.00%. GENERAL: Well-developed, well-nourished female in no acute distress.  HEENT: normocephalic HEART: normal rate RESP: normal effort ABDOMEN: Soft, non-tender,  gravid appropriate for gestational age EXTREMITIES: Nontender, no edema NEURO: alert and oriented SPECULUM EXAM: Deferred     FHT:  Baseline 135 , moderate variability, accelerations present, no decelerations Contractions: occasional, mild to palpation and per pt   Labs: Results for orders placed during the hospital encounter of 08/13/12 (from the past 24 hour(s))  CBC     Status: Abnormal   Collection Time    08/13/12  5:12 PM      Result Value Range   WBC 12.7 (*) 4.0 - 10.5 K/uL   RBC 4.05  3.87 - 5.11 MIL/uL   Hemoglobin 11.5 (*) 12.0 - 15.0 g/dL   HCT 16.1 (*) 09.6 - 04.5 %   MCV 86.4  78.0 - 100.0 fL   MCH 28.4  26.0 - 34.0 pg   MCHC 32.9  30.0 - 36.0 g/dL   RDW 40.9  81.1 - 91.4 %   Platelets 224  150 - 400 K/uL  COMPREHENSIVE METABOLIC PANEL     Status: Abnormal   Collection Time    08/13/12  5:12 PM      Result Value Range    Sodium 135  135 - 145 mEq/L   Potassium 3.9  3.5 - 5.1 mEq/L   Chloride 103  96 - 112 mEq/L   CO2 23  19 - 32 mEq/L   Glucose, Bld 77  70 - 99 mg/dL   BUN 14  6 - 23 mg/dL   Creatinine, Ser 7.82  0.50 - 1.10 mg/dL   Calcium 8.7  8.4 - 95.6 mg/dL   Total Protein 6.5  6.0 - 8.3 g/dL   Albumin 2.5 (*) 3.5 - 5.2 g/dL   AST 20  0 - 37 U/L   ALT 10  0 - 35 U/L   Alkaline Phosphatase 138 (*) 39 - 117 U/L   Total Bilirubin 0.3  0.3 - 1.2 mg/dL   GFR calc non Af Amer 89 (*) >90 mL/min   GFR calc Af Amer >90  >90 mL/min  LACTATE DEHYDROGENASE     Status: None   Collection Time    08/13/12  5:12 PM      Result Value Range   LDH 136  94 - 250 U/L  URIC ACID     Status: None   Collection Time    08/13/12  5:12 PM      Result Value Range   Uric Acid, Serum 5.8  2.4 - 7.0 mg/dL    Assessment: 1. Chronic hypertension in pregnancy, third trimester     Plan: Called Dr Tenny Craw to discuss assessment and findings Plan for C/S tonight 8 hours after last meal Dr Tenny Craw to assume care at this time     Medication List    ASK your doctor about these medications       insulin NPH 100 UNIT/ML injection  Commonly known as:  HUMULIN N,NOVOLIN N  Inject 12-20 Units into the skin 2 (two) times daily. 12 units in the morning and 20 units at night     ONE TOUCH ULTRA TEST test strip  Generic drug:  glucose blood     prenatal multivitamin Tabs  Take 1 tablet by mouth at bedtime.     ROLAIDS PO  Take 15 mLs by mouth 2 (two) times daily as needed (for heartburn).        Sharen Counter Certified Nurse-Midwife 08/13/2012 6:34 PM

## 2012-08-13 NOTE — OR Nursing (Addendum)
Uterus massaged by S. Satterfield Rn. Two tubes of cord blood sent to lab.  20cc of blood evacuated from uterus during uterine massage.  Filshie clips applied to right and left fallopian tubes by Dr. Fredia Sorrow on 08/13/2012. Lot number [id number],expiration date 2016-09,and manufacture [employer].

## 2012-08-13 NOTE — MAU Note (Signed)
Patient sent from the office for evaluation of elevated blood pressure. Patient is scheduled for a repeat cesarean section on 3-14. States she is having headaches. Reports good fetal movement, no contractions or leaking.

## 2012-08-13 NOTE — Anesthesia Preprocedure Evaluation (Deleted)
Anesthesia Evaluation Anesthesia Physical Anesthesia Plan Anesthesia Quick Evaluation  

## 2012-08-13 NOTE — Op Note (Signed)
Pre-Operative Diagnosis: 1) 38 week intrauterine pregnancy 2) A2 gestational diabetes mellitus on insulin 3) Severe gestational hypertension 4) History of prior cesarean section, desires repeat 5) Desired permanent sterilization 6) Maternal obesity Postoperative Diagnosis: Same Procedure: Repeat Cesarean section via Pfannenstiel skin incision with bilateral tubal ligation with filshie clips Surgeon: Dr. Waynard Reeds Assistant: None Operative Findings: Vigorous female infant in vertex presentation with apgars of 9 & 9.  Normal ovaries and tubes.  Thin lower uterine segment. Specimen: Placenta for disposal EBL: Total I/O In: 1600 [I.V.:1600] Out: 750 [Urine:50; Blood:700]   Procedure:Emma George is an 42 year old gravida 3 para 1011 at 33 weeks and 0 days estimated gestational age who presents for cesarean section. The patient is an A2 gestational diabetic currently on insulin. Over the last several weeks of her pregnancy she's been noted to have elevated blood pressures. Today in the office she had a blood pressure of 170/110 with 2+ protein, and a persistent headache. She was sent to maternity admissions for serial blood pressures, preeclampsia labs, and a nonstress test. Lab work was normal, however her blood pressures continued to be elevated in the severe range the decision was made to proceed with repeat cesarean section for severe range blood pressures. She also desired permanent sterilization. The patient was appropriately identified during a time out procedure prior to proceeding with the case. Following the appropriate informed consent the patient was brought to the operating room where spinal anesthesia was administered and found to be adequate. 10 cc of quarter percent Marcaine was injected into the skin at the incision site to to some slight discomfort with the Allis test at the umbilicus. She was placed in the dorsal supine position with a leftward tilt. She was prepped and draped in the normal  sterile fashion. Scalpel was then used to make a Pfannenstiel skin incision which was carried down to the underlying layers of soft tissue to the fascia. The fascia was incised in the midline and the fascial incision was extended laterally with Mayo scissors. The superior aspect of the fascial incision was grasped with Coker clamps x2, tented up and the rectus muscles dissected off sharply with the electrocautery unit area and the same procedure was repeated on the inferior aspect of the fascial incision. The patient did have mild discomfort at this portion of the case and 3% Nesacaine was poured into the surgical site. The rectus muscles were separated in the midline. The abdominal peritoneum was identified, tented up, entered sharply, and the incision was extended superiorly and inferiorly with good visualization of the bladder. The Alexis retractor was then deployed. The vesicouterine peritoneum was identified, tented up, entered sharply, and the bladder flap was created digitally. Scalpel was then used to make a low transverse incision on the uterus which was extended laterally with blunt dissection. The fetal vertex was identified, delivered with the assistance of a vaccuum through the uterine incision followed by the body. The infant was bulb suctioned on the operative field cried vigorously, cord was clamped and cut and the infant was passed to the waiting neonatologist. Placenta was then delivered spontaneously, the uterus was cleared of all clot and debris. The uterine incision was repaired with #1 chromic in running locked fashion. Ovaries and tubes were inspected and normal. The right fallopian tube was grasped with a Babcock clamp and tented up and a clear space was identified in the mesosalpinx. The Filshie clip was applied across the fallopian tube. The same procedure was repeated on the left fallopian tube.The  Alexis retractor was removed. The uterus was returned to the abdominal cavity the abdominal  cavity was cleared of all clot and debris. The abdominal peritoneum was reapproximated with 2-0 Vicryl in a running fashion, the rectus muscles was reapproximated with #1 chromic in a running fashion. The fascia was closed with a looped PDS in a running fashion. The skin was closed with 4-0 vicryl in a subcuticular fashion and Dermabond. All sponge lap and needle counts were correct x2. Patient tolerated the procedure well and recovered in stable condition following the procedure.

## 2012-08-13 NOTE — Transfer of Care (Signed)
Immediate Anesthesia Transfer of Care Note  Patient: Emma George  Procedure(s) Performed: Procedure(s): Repeat cesarean section with delivery of baby boy at  2242. Apgars 9/9.  Bilateral tubal ligation with filshie clips. (Bilateral)  Patient Location: PACU  Anesthesia Type:Spinal  Level of Consciousness: awake and oriented  Airway & Oxygen Therapy: Patient Spontanous Breathing  Post-op Assessment: Report given to PACU RN and Post -op Vital signs reviewed and stable  Post vital signs: Reviewed  Complications: No apparent anesthesia complications

## 2012-08-13 NOTE — Progress Notes (Signed)
Pt has been on NPH insulin 12 units QAM and 20 units QHS No insulin ordered at this time.   CBGs ordered Q4 hrs.  Will add insulin coverage as needed

## 2012-08-13 NOTE — H&P (Signed)
Emma George is a 42 y.o. female presenting for elevated BPs  42 yo G3P1011 @ 38+0 weeks presents from the office for evaluation of elevated blood pressures.  The patient is a A2 gestational diabetic and is now on insulin. Over the last few office visits she has been noticed to have elevated blood pressures.  Last Thursday she was evaluated in MAU for elevated BPs. She was without symptoms at that time.  Labs were normal.  Today she presents c/o 2 day h/o headache.  She has not yet taken any medication for her headaches.  In the office her BP was 170/110 and she had 2+ protein in her urine.  In MAU, her BPs continue to be elevated but her labs are normal.  Given severe range BPs with symptoms will proceed with delivery.  The patient desires repeat cesarean section with tubal ligation for permanent sterilization.  R/B/A reviewed and she wishes to proceed.  History OB History   Grav Para Term Preterm Abortions TAB SAB Ect Mult Living   3 1 1  0 1 0 1 0 0 1     Past Medical History  Diagnosis Date  . PCOS (polycystic ovarian syndrome)   . Gestational diabetes requiring insulin   . GERD (gastroesophageal reflux disease)   . Chronic kidney disease    Past Surgical History  Procedure Laterality Date  . Cesarean section    . Dilation and curettage of uterus    . Cholecysectomy    . Knee surgery     Family History: family history includes Cancer in her maternal grandmother and paternal grandmother; Diabetes in her father, paternal grandfather, and paternal grandmother; Heart disease in her father, paternal grandfather, and paternal grandmother; Hyperlipidemia in her father; Hypertension in her mother; and Stroke in her maternal grandfather. Social History:  reports that she quit smoking about 8 years ago. Her smoking use included Cigarettes. She has a 15 pack-year smoking history. She does not have any smokeless tobacco history on file. She reports that she does not drink alcohol or use illicit  drugs.   Prenatal Transfer Tool  Maternal Diabetes: A2 gestational diabetes on insulin Genetic Screening: normal Maternal Ultrasounds/Referrals: Normal Fetal Ultrasounds or other Referrals:  None Maternal Substance Abuse:  None Significant Maternal Medications:  Insulin Significant Maternal Lab Results:  None Other Comments:  None  ROS: as above    Blood pressure 149/87, pulse 76, temperature 97.7 F (36.5 C), temperature source Oral, resp. rate 16, height 5' 5.5" (1.664 m), weight 128.187 kg (282 lb 9.6 oz), SpO2 99.00%. Exam Physical Exam  Prenatal labs: ABO, Rh:   Antibody:   Rubella:   RPR:    HBsAg:    HIV:    GBS:     Assessment/Plan: 1) Admit 2) Ancef 3gm OCTOR 3) SCDs to OR 4) CBGs Q 4 hrs 5) Concent for repeat C/S with BTL  ROSS,KENDRA H. 08/13/2012, 6:55 PM

## 2012-08-13 NOTE — Anesthesia Preprocedure Evaluation (Signed)
Anesthesia Evaluation  Patient identified by MRN, date of birth, ID band Patient awake    Reviewed: Allergy & Precautions, H&P , NPO status , Patient's Chart, lab work & pertinent test results  Airway Mallampati: III      Dental no notable dental hx.    Pulmonary neg pulmonary ROS,  breath sounds clear to auscultation  Pulmonary exam normal       Cardiovascular Exercise Tolerance: Good hypertension, negative cardio ROS  Rhythm:regular Rate:Normal     Neuro/Psych negative neurological ROS  negative psych ROS   GI/Hepatic negative GI ROS, Neg liver ROS, GERD-  ,  Endo/Other  negative endocrine ROSdiabetesMorbid obesity  Renal/GU Renal diseasenegative Renal ROS  negative genitourinary   Musculoskeletal   Abdominal Normal abdominal exam  (+)   Peds  Hematology negative hematology ROS (+)   Anesthesia Other Findings PCOS (polycystic ovarian syndrome)     Gestational diabetes requiring insulin        GERD (gastroesophageal reflux disease)    Reproductive/Obstetrics (+) Pregnancy                           Anesthesia Physical Anesthesia Plan  ASA: III and emergent  Anesthesia Plan: Spinal   Post-op Pain Management:    Induction:   Airway Management Planned:   Additional Equipment:   Intra-op Plan:   Post-operative Plan:   Informed Consent: I have reviewed the patients History and Physical, chart, labs and discussed the procedure including the risks, benefits and alternatives for the proposed anesthesia with the patient or authorized representative who has indicated his/her understanding and acceptance.     Plan Discussed with: Anesthesiologist, CRNA and Surgeon  Anesthesia Plan Comments:         Anesthesia Quick Evaluation

## 2012-08-14 ENCOUNTER — Encounter (HOSPITAL_COMMUNITY): Payer: Self-pay | Admitting: *Deleted

## 2012-08-14 LAB — CBC
HCT: 31.4 % — ABNORMAL LOW (ref 36.0–46.0)
MCHC: 33.1 g/dL (ref 30.0–36.0)
MCV: 86.5 fL (ref 78.0–100.0)
Platelets: 192 10*3/uL (ref 150–400)
RDW: 14 % (ref 11.5–15.5)

## 2012-08-14 LAB — RPR: RPR Ser Ql: NONREACTIVE

## 2012-08-14 LAB — GLUCOSE, CAPILLARY
Glucose-Capillary: 96 mg/dL (ref 70–99)
Glucose-Capillary: 90 mg/dL (ref 70–99)

## 2012-08-14 MED ORDER — ONDANSETRON HCL 4 MG/2ML IJ SOLN: 4.0000 mg | Freq: Three times a day (TID) | INTRAMUSCULAR | Status: DC | PRN

## 2012-08-14 MED ORDER — DIBUCAINE 1 % RE OINT: 1.0000 "application " | TOPICAL_OINTMENT | RECTAL | Status: DC | PRN

## 2012-08-14 MED ORDER — FENTANYL CITRATE 0.05 MG/ML IJ SOLN
INTRAMUSCULAR | Status: AC
  Filled 2012-08-14: qty 2

## 2012-08-14 MED ORDER — KETOROLAC TROMETHAMINE 30 MG/ML IJ SOLN
INTRAMUSCULAR | Status: AC
  Filled 2012-08-14: qty 1

## 2012-08-14 MED ORDER — SIMETHICONE 80 MG PO CHEW: 80.0000 mg | CHEWABLE_TABLET | ORAL | Status: DC | PRN

## 2012-08-14 MED ORDER — NALOXONE HCL 1 MG/ML IJ SOLN
1.0000 ug/kg/h | INTRAVENOUS | Status: DC | PRN
  Filled 2012-08-14: qty 2

## 2012-08-14 MED ORDER — ZOLPIDEM TARTRATE 5 MG PO TABS: 5.0000 mg | ORAL_TABLET | Freq: Every evening | ORAL | Status: DC | PRN

## 2012-08-14 MED ORDER — ACETAMINOPHEN 10 MG/ML IV SOLN
1000.0000 mg | Freq: Four times a day (QID) | INTRAVENOUS | Status: AC | PRN
  Administered 2012-08-14: 1000 mg via INTRAVENOUS
  Filled 2012-08-14: qty 100

## 2012-08-14 MED ORDER — NALBUPHINE HCL 10 MG/ML IJ SOLN
5.0000 mg | INTRAMUSCULAR | Status: DC | PRN
  Administered 2012-08-14: 10 mg via INTRAVENOUS
  Filled 2012-08-14 (×2): qty 1

## 2012-08-14 MED ORDER — MENTHOL 3 MG MT LOZG: 1.0000 | LOZENGE | OROMUCOSAL | Status: DC | PRN

## 2012-08-14 MED ORDER — DIPHENHYDRAMINE HCL 50 MG/ML IJ SOLN: 12.5000 mg | INTRAMUSCULAR | Status: DC | PRN

## 2012-08-14 MED ORDER — IBUPROFEN 600 MG PO TABS
600.0000 mg | ORAL_TABLET | Freq: Four times a day (QID) | ORAL | Status: DC
  Administered 2012-08-14 – 2012-08-16 (×7): 600 mg via ORAL
  Filled 2012-08-14 (×7): qty 1

## 2012-08-14 MED ORDER — DIPHENHYDRAMINE HCL 25 MG PO CAPS: 25.0000 mg | ORAL_CAPSULE | ORAL | Status: DC | PRN

## 2012-08-14 MED ORDER — LACTATED RINGERS IV SOLN
INTRAVENOUS | Status: DC
  Administered 2012-08-14: 11:00:00 via INTRAVENOUS

## 2012-08-14 MED ORDER — PRENATAL MULTIVITAMIN CH
1.0000 | ORAL_TABLET | Freq: Every day | ORAL | Status: DC
  Administered 2012-08-15 – 2012-08-16 (×2): 1 via ORAL
  Filled 2012-08-14 (×2): qty 1

## 2012-08-14 MED ORDER — WITCH HAZEL-GLYCERIN EX PADS: 1.0000 "application " | MEDICATED_PAD | CUTANEOUS | Status: DC | PRN

## 2012-08-14 MED ORDER — METOCLOPRAMIDE HCL 5 MG/ML IJ SOLN: 10.0000 mg | Freq: Three times a day (TID) | INTRAMUSCULAR | Status: DC | PRN

## 2012-08-14 MED ORDER — NALOXONE HCL 0.4 MG/ML IJ SOLN: 0.4000 mg | INTRAMUSCULAR | Status: DC | PRN

## 2012-08-14 MED ORDER — HYDROMORPHONE HCL 2 MG PO TABS: 2.0000 mg | ORAL_TABLET | ORAL | Status: DC | PRN

## 2012-08-14 MED ORDER — NALBUPHINE HCL 10 MG/ML IJ SOLN
5.0000 mg | INTRAMUSCULAR | Status: DC | PRN
  Filled 2012-08-14: qty 1

## 2012-08-14 MED ORDER — TETANUS-DIPHTH-ACELL PERTUSSIS 5-2.5-18.5 LF-MCG/0.5 IM SUSP
0.5000 mL | Freq: Once | INTRAMUSCULAR | Status: AC
  Administered 2012-08-15: 0.5 mL via INTRAMUSCULAR
  Filled 2012-08-14: qty 0.5

## 2012-08-14 MED ORDER — SODIUM CHLORIDE 0.9 % IJ SOLN: 3.0000 mL | INTRAMUSCULAR | Status: DC | PRN

## 2012-08-14 MED ORDER — OXYTOCIN 40 UNITS IN LACTATED RINGERS INFUSION - SIMPLE MED: 62.5000 mL/h | INTRAVENOUS | Status: AC

## 2012-08-14 MED ORDER — ONDANSETRON HCL 4 MG/2ML IJ SOLN: 4.0000 mg | INTRAMUSCULAR | Status: DC | PRN

## 2012-08-14 MED ORDER — DIPHENHYDRAMINE HCL 25 MG PO CAPS
25.0000 mg | ORAL_CAPSULE | Freq: Four times a day (QID) | ORAL | Status: DC | PRN
  Administered 2012-08-14: 25 mg via ORAL
  Filled 2012-08-14: qty 1

## 2012-08-14 MED ORDER — LABETALOL HCL 5 MG/ML IV SOLN
10.0000 mg | INTRAVENOUS | Status: DC | PRN
  Administered 2012-08-14 (×4): 10 mg via INTRAVENOUS
  Filled 2012-08-14: qty 4

## 2012-08-14 MED ORDER — OXYCODONE-ACETAMINOPHEN 5-325 MG PO TABS: 1.0000 | ORAL_TABLET | ORAL | Status: DC | PRN

## 2012-08-14 MED ORDER — LABETALOL HCL 5 MG/ML IV SOLN
INTRAVENOUS | Status: AC
  Filled 2012-08-14: qty 4

## 2012-08-14 MED ORDER — ONDANSETRON HCL 4 MG PO TABS: 4.0000 mg | ORAL_TABLET | ORAL | Status: DC | PRN

## 2012-08-14 MED ORDER — SENNOSIDES-DOCUSATE SODIUM 8.6-50 MG PO TABS
2.0000 | ORAL_TABLET | Freq: Every day | ORAL | Status: DC
  Administered 2012-08-14 – 2012-08-15 (×2): 2 via ORAL

## 2012-08-14 MED ORDER — SCOPOLAMINE 1 MG/3DAYS TD PT72
MEDICATED_PATCH | TRANSDERMAL | Status: AC
  Filled 2012-08-14: qty 1

## 2012-08-14 MED ORDER — SIMETHICONE 80 MG PO CHEW
80.0000 mg | CHEWABLE_TABLET | Freq: Three times a day (TID) | ORAL | Status: DC
  Administered 2012-08-14 – 2012-08-16 (×8): 80 mg via ORAL

## 2012-08-14 MED ORDER — DIPHENHYDRAMINE HCL 50 MG/ML IJ SOLN: 25.0000 mg | INTRAMUSCULAR | Status: DC | PRN

## 2012-08-14 MED ORDER — LANOLIN HYDROUS EX OINT: 1.0000 "application " | TOPICAL_OINTMENT | CUTANEOUS | Status: DC | PRN

## 2012-08-14 NOTE — Progress Notes (Signed)
Post Op Day 1 Subjective: no complaints  Objective: Blood pressure 130/87, pulse 64, temperature 97.8 F (36.6 C), temperature source Oral, resp. rate 18, height 5' 5.5" (1.664 m), weight 128.187 kg (282 lb 9.6 oz), SpO2 97.00%, breastfeeding.  Physical Exam:  General: alert Lochia: appropriate Uterine Fundus: firm Incision: no significant drainage   Recent Labs  08/13/12 1712 08/14/12 0620  HGB 11.5* 10.4*  HCT 35.0* 31.4*    Assessment/Plan: Continue post op observation.  Check glucose fasting and post prandial.   LOS: 1 day   KAPLAN,RICHARD D 08/14/2012, 8:45 AM

## 2012-08-14 NOTE — Anesthesia Postprocedure Evaluation (Signed)
  Anesthesia Post Note  Patient: Emma George  Procedure(s) Performed: Procedure(s) (LRB): Repeat cesarean section with delivery of baby boy at  2242. Apgars 9/9.  Bilateral tubal ligation with filshie clips. (Bilateral)  Anesthesia type: Spinal  Patient location: PACU  Post pain: Pain level controlled  Post assessment: Post-op Vital signs reviewed  Last Vitals:  Filed Vitals:   08/13/12 2347  BP: 138/121  Pulse: 69  Temp:   Resp: 18    Post vital signs: Reviewed  Level of consciousness: awake  Complications: No apparent anesthesia complications

## 2012-08-14 NOTE — Anesthesia Postprocedure Evaluation (Signed)
  Anesthesia Post-op Note  Patient: Emma George  Procedure(s) Performed: Procedure(s): Repeat cesarean section with delivery of baby boy at  2242. Apgars 9/9.  Bilateral tubal ligation with filshie clips. (Bilateral)  Patient Location: Mother/Baby  Anesthesia Type:Regional  Level of Consciousness: awake, alert  and oriented  Airway and Oxygen Therapy: Patient Spontanous Breathing  Post-op Pain: mild  Post-op Assessment: Patient's Cardiovascular Status Stable and Respiratory Function Stable  Post-op Vital Signs: stable  Complications: No apparent anesthesia complications

## 2012-08-15 LAB — GLUCOSE, CAPILLARY
Glucose-Capillary: 99 mg/dL (ref 70–99)
Glucose-Capillary: 94 mg/dL (ref 70–99)

## 2012-08-15 LAB — BIRTH TISSUE RECOVERY COLLECTION (PLACENTA DONATION)

## 2012-08-15 NOTE — Progress Notes (Signed)
Patient is eating, ambulating, voiding.  Pain control is good.  Appropriate lochia.  No HA, vision change, RUQ pain.  No other complaints, but mild cough.  Filed Vitals:   08/14/12 1841 08/14/12 2030 08/14/12 2352 08/15/12 0522  BP:  144/94 143/94 149/89  Pulse:  84 83 81  Temp:  98.1 F (36.7 C) 98.5 F (36.9 C) 98.1 F (36.7 C)  TempSrc:  Oral Oral Oral  Resp: 20 18 18 18   Height:      Weight:      SpO2: 99%  96% 97%    Fundus firm Perineum without swelling. Inc: c/d/i Ext: no CT  Lab Results  Component Value Date   WBC 15.6* 08/14/2012   HGB 10.4* 08/14/2012   HCT 31.4* 08/14/2012   MCV 86.5 08/14/2012   PLT 192 08/14/2012    --/--/A POS (03/10 1712)  A/P Post op day #2 s/p repeat c/s with BTL, severe preeclampsia mild range BPs, continue to monitor  Routine care.    Emma George

## 2012-08-16 MED ORDER — HYDROMORPHONE HCL 2 MG PO TABS: 2.0000 mg | ORAL_TABLET | ORAL | Status: DC | PRN

## 2012-08-16 NOTE — Discharge Summary (Signed)
Emma George, Emma George             ACCOUNT NO.:  [id number]  MEDICAL RECORD NO.:  [id number]  LOCATION:  9101                          FACILITY:  [facility]  PHYSICIAN:  Malva Limes, M.D.    DATE OF BIRTH:  1971/04/14  DATE OF ADMISSION:  08/13/2012 DATE OF DISCHARGE:  08/16/2012                              DISCHARGE SUMMARY   PRINCIPAL DISCHARGE DIAGNOSES: 1. Intrauterine pregnancy at term. 2. A2 gestational diabetes, on insulin. 3. Severe gestational hypertension. 4. History of prior cesarean section. 5. Declines attempted vaginal birth after cesarean (VBAC). 6. Desires permanent sterilization. 7. Maternal obesity.  PROCEDURES:  Repeat low-transverse cesarean section with bilateral tubal ligation.  HISTORY OF PRESENT ILLNESS:  Ms. Galanti is a 42 year old, black female, G3, P1-0-1-1, at term who was admitted to [facility] for repeat cesarean section on August 13, 2012.  The procedure was performed early because on the day of admission, the patient's blood pressure was 170/110 with 2+ proteinuria and a persistent headache.  For complete description of the operative procedure, please see the dictated operative note.  The patient's postoperative course was benign.  Her blood sugars were checked off of medication throughout the postpartum course were all normal.  Her blood pressures were slightly elevated. However, all of her PIH labs were within the normal range.  The patient's blood pressure on the day of discharge were in the 140/80 to 90.  The patient was discharged to home on postoperative day #3.  At that time, she was ambulating without difficulty.  Her incision appeared to be healing well.  She had no headache.  She was instructed to check her blood pressure twice a day and call if it was greater than 150/95. She was also told to call with any temperature elevation changes or incision or severe pain.  She is going to be re-evaluated in the office on Monday, which is 3 to  4 days from now.          ______________________________ Malva Limes, M.D.     MA/MEDQ  D:  08/16/2012  T:  08/16/2012  Job:  [id number]

## 2012-08-16 NOTE — Progress Notes (Signed)
POD#3 Pt without complaints. No headaches. B/Ps still elevated, stable. All glucoses wnl. IMP/ POD#3 with preeclampsia and GDM on insulin PLAN/ will discharge, check B/Ps at home, and recheck monday

## 2012-08-17 ENCOUNTER — Other Ambulatory Visit (HOSPITAL_COMMUNITY): Payer: 59

## 2012-08-17 ENCOUNTER — Inpatient Hospital Stay (HOSPITAL_COMMUNITY)
Admission: AD | Admit: 2012-08-17 | Discharge: 2012-08-19 | DRG: 776 | Disposition: A | Discharge status: Home / Self Care | Payer: 59 | Source: Ambulatory Visit | Attending: Obstetrics and Gynecology | Admitting: Obstetrics and Gynecology

## 2012-08-17 ENCOUNTER — Encounter (HOSPITAL_COMMUNITY): Payer: Self-pay | Admitting: Family

## 2012-08-17 DIAGNOSIS — A4902 Methicillin resistant Staphylococcus aureus infection, unspecified site: Secondary | ICD-10-CM | POA: Diagnosis present

## 2012-08-17 DIAGNOSIS — IMO0002 Reserved for concepts with insufficient information to code with codable children: Secondary | ICD-10-CM

## 2012-08-17 LAB — TYPE AND SCREEN
Antibody Screen: NEGATIVE
Unit division: 0

## 2012-08-17 LAB — CBC
HCT: 33.2 % — ABNORMAL LOW (ref 36.0–46.0)
Hemoglobin: 11 g/dL — ABNORMAL LOW (ref 12.0–15.0)
MCH: 28.9 pg (ref 26.0–34.0)
MCV: 87.4 fL (ref 78.0–100.0)
Platelets: 334 10*3/uL (ref 150–400)
RBC: 3.8 MIL/uL — ABNORMAL LOW (ref 3.87–5.11)
WBC: 14.1 10*3/uL — ABNORMAL HIGH (ref 4.0–10.5)

## 2012-08-17 LAB — COMPREHENSIVE METABOLIC PANEL
AST: 64 U/L — ABNORMAL HIGH (ref 0–37)
CO2: 27 mEq/L (ref 19–32)
Calcium: 8.9 mg/dL (ref 8.4–10.5)
Chloride: 102 mEq/L (ref 96–112)
Creatinine, Ser: 0.92 mg/dL (ref 0.50–1.10)
GFR calc Af Amer: 90 mL/min — ABNORMAL LOW (ref >90)
GFR calc non Af Amer: 77 mL/min — ABNORMAL LOW (ref >90)
Glucose, Bld: 87 mg/dL (ref 70–99)
Total Bilirubin: 0.3 mg/dL (ref 0.3–1.2)

## 2012-08-17 LAB — LACTATE DEHYDROGENASE: LDH: 171 U/L (ref 94–250)

## 2012-08-17 MED ORDER — LABETALOL HCL 100 MG PO TABS
200.0000 mg | ORAL_TABLET | Freq: Once | ORAL | Status: AC
  Administered 2012-08-18: 200 mg via ORAL

## 2012-08-17 MED ORDER — SENNOSIDES-DOCUSATE SODIUM 8.6-50 MG PO TABS
2.0000 | ORAL_TABLET | Freq: Every day | ORAL | Status: DC
  Administered 2012-08-18: 2 via ORAL

## 2012-08-17 MED ORDER — LABETALOL HCL 5 MG/ML IV SOLN
10.0000 mg | INTRAVENOUS | Status: DC | PRN
  Administered 2012-08-18 (×4): 10 mg via INTRAVENOUS
  Filled 2012-08-17 (×2): qty 4

## 2012-08-17 MED ORDER — WITCH HAZEL-GLYCERIN EX PADS: 1.0000 "application " | MEDICATED_PAD | CUTANEOUS | Status: DC | PRN

## 2012-08-17 MED ORDER — MAGNESIUM SULFATE BOLUS VIA INFUSION
6.0000 g | Freq: Once | INTRAVENOUS | Status: AC
  Administered 2012-08-18: 6 g via INTRAVENOUS
  Filled 2012-08-17: qty 500

## 2012-08-17 MED ORDER — DIPHENHYDRAMINE HCL 25 MG PO CAPS: 25.0000 mg | ORAL_CAPSULE | Freq: Four times a day (QID) | ORAL | Status: DC | PRN

## 2012-08-17 MED ORDER — ZOLPIDEM TARTRATE 5 MG PO TABS: 5.0000 mg | ORAL_TABLET | Freq: Every evening | ORAL | Status: DC | PRN

## 2012-08-17 MED ORDER — IBUPROFEN 600 MG PO TABS
600.0000 mg | ORAL_TABLET | Freq: Four times a day (QID) | ORAL | Status: DC
  Administered 2012-08-17 – 2012-08-19 (×6): 600 mg via ORAL
  Filled 2012-08-17 (×6): qty 1

## 2012-08-17 MED ORDER — HYDROMORPHONE HCL 2 MG PO TABS: 4.0000 mg | ORAL_TABLET | ORAL | Status: DC | PRN

## 2012-08-17 MED ORDER — MAGNESIUM SULFATE 40 G IN LACTATED RINGERS - SIMPLE: 2.0000 g/h | Freq: Once | INTRAVENOUS | Status: DC

## 2012-08-17 MED ORDER — MAGNESIUM SULFATE BOLUS VIA INFUSION
4.0000 g | Freq: Once | INTRAVENOUS | Status: DC
  Filled 2012-08-17: qty 500

## 2012-08-17 MED ORDER — PRENATAL MULTIVITAMIN CH
1.0000 | ORAL_TABLET | Freq: Every day | ORAL | Status: DC
  Administered 2012-08-18 (×2): 1 via ORAL
  Filled 2012-08-17 (×2): qty 1

## 2012-08-17 MED ORDER — DIBUCAINE 1 % RE OINT
1.0000 "application " | TOPICAL_OINTMENT | RECTAL | Status: DC | PRN
  Filled 2012-08-17: qty 28

## 2012-08-17 MED ORDER — LABETALOL HCL 200 MG PO TABS
200.0000 mg | ORAL_TABLET | Freq: Two times a day (BID) | ORAL | Status: DC
  Administered 2012-08-18 – 2012-08-19 (×3): 200 mg via ORAL
  Filled 2012-08-17 (×4): qty 1

## 2012-08-17 MED ORDER — ONDANSETRON HCL 4 MG/2ML IJ SOLN: 4.0000 mg | INTRAMUSCULAR | Status: DC | PRN

## 2012-08-17 MED ORDER — MAGNESIUM SULFATE 40 G IN LACTATED RINGERS - SIMPLE
2.0000 g/h | INTRAVENOUS | Status: AC
  Filled 2012-08-17: qty 500

## 2012-08-17 MED ORDER — TETANUS-DIPHTH-ACELL PERTUSSIS 5-2.5-18.5 LF-MCG/0.5 IM SUSP
0.5000 mL | Freq: Once | INTRAMUSCULAR | Status: DC
  Filled 2012-08-17: qty 0.5

## 2012-08-17 MED ORDER — LANOLIN HYDROUS EX OINT: TOPICAL_OINTMENT | CUTANEOUS | Status: DC | PRN

## 2012-08-17 MED ORDER — SIMETHICONE 80 MG PO CHEW: 80.0000 mg | CHEWABLE_TABLET | ORAL | Status: DC | PRN

## 2012-08-17 MED ORDER — PRENATAL MULTIVITAMIN CH: 1.0000 | ORAL_TABLET | Freq: Every day | ORAL | Status: DC

## 2012-08-17 MED ORDER — MAGNESIUM SULFATE 40 G IN LACTATED RINGERS - SIMPLE: 6.0000 g/h | Freq: Once | INTRAVENOUS | Status: DC

## 2012-08-17 MED ORDER — MAGNESIUM SULFATE 40 G IN LACTATED RINGERS - SIMPLE
2.0000 g/h | INTRAVENOUS | Status: AC
  Administered 2012-08-18: 2 g/h via INTRAVENOUS
  Filled 2012-08-17 (×2): qty 500

## 2012-08-17 MED ORDER — IBUPROFEN 600 MG PO TABS: 600.0000 mg | ORAL_TABLET | Freq: Four times a day (QID) | ORAL | Status: DC

## 2012-08-17 MED ORDER — LACTATED RINGERS IV SOLN
INTRAVENOUS | Status: DC
  Administered 2012-08-18 (×2): via INTRAVENOUS

## 2012-08-17 MED ORDER — BENZOCAINE-MENTHOL 20-0.5 % EX AERO
1.0000 "application " | INHALATION_SPRAY | CUTANEOUS | Status: DC | PRN
  Filled 2012-08-17: qty 56

## 2012-08-17 MED ORDER — SENNOSIDES-DOCUSATE SODIUM 8.6-50 MG PO TABS: 2.0000 | ORAL_TABLET | Freq: Every day | ORAL | Status: DC

## 2012-08-17 MED ORDER — MAGNESIUM SULFATE 40 G IN LACTATED RINGERS - SIMPLE: 2.0000 g/h | INTRAVENOUS | Status: DC

## 2012-08-17 MED ORDER — ONDANSETRON HCL 4 MG PO TABS
4.0000 mg | ORAL_TABLET | ORAL | Status: DC | PRN
  Administered 2012-08-18: 4 mg via ORAL
  Filled 2012-08-17: qty 1

## 2012-08-17 NOTE — MAU Note (Signed)
Patient presents to MAU with c/o headache since middle of last night. Delivered by c/s 3/10 d/t elevated pressures; discharged home yesterday. Called into office tonight regarding h/a and was told to come in for evaluation.  Dilaudid taken at 1500 today; headache unrelieved. Denies dizziness, blurred vision, or URQ pain.

## 2012-08-17 NOTE — MAU Provider Note (Signed)
History     CSN: [id number]  Arrival date and time: 08/17/12 2100   First Emma George Initiated Contact with Patient 08/17/12 2134      Chief Complaint  Patient presents with  . Hypertension  . Headache   HPI  Pt is a [id number] here 4 days postpartum with report of severe headache that started yesterday.  Pt had a csection due to elevated blood pressure.  Reports did not have magnesium sulfate while in the hospital.  No report of epigastric pain or vision changes.  Bleeding has decreased and incisional pain improved with pain medication.    Past Medical History  Diagnosis Date  . PCOS (polycystic ovarian syndrome)   . Gestational diabetes requiring insulin   . GERD (gastroesophageal reflux disease)   . Chronic kidney disease     Kidney Stones    Past Surgical History  Procedure Laterality Date  . Cesarean section    . Dilation and curettage of uterus    . Cholecysectomy    . Knee surgery    . Cesarean section Bilateral 08/13/2012    Procedure: Repeat cesarean section with delivery of baby boy at  2242. Apgars 9/9.  Bilateral tubal ligation with filshie clips.;  Surgeon: Freddrick March. Tenny Craw, MD;  Location: [facility];  Service: Obstetrics;  Laterality: Bilateral;    Family History  Problem Relation Age of Onset  . Hypertension Mother   . Diabetes Father   . Heart disease Father   . Hyperlipidemia Father   . Cancer Maternal Grandmother   . Stroke Maternal Grandfather   . Diabetes Paternal Grandmother   . Heart disease Paternal Grandmother   . Cancer Paternal Grandmother   . Diabetes Paternal Grandfather   . Heart disease Paternal Grandfather     History  Substance Use Topics  . Smoking status: Former Smoker -- 1.00 packs/day for 15 years    Types: Cigarettes    Quit date: 09/17/2003  . Smokeless tobacco: Not on file  . Alcohol Use: No    Allergies:  Allergies  Allergen Reactions  . Codeine Other (See Comments)    Skin crawling    Prescriptions prior to admission   Medication Sig Dispense Refill  . HYDROmorphone (DILAUDID) 2 MG tablet Take 2 mg by mouth every 3 (three) hours as needed for pain.      . Prenatal Vit-Fe Fumarate-FA (PRENATAL MULTIVITAMIN) TABS Take 1 tablet by mouth at bedtime.      . [DISCONTINUED] HYDROmorphone (DILAUDID) 2 MG tablet Take 1 tablet (2 mg total) by mouth every 3 (three) hours as needed.  30 tablet  0    Review of Systems  Eyes: Negative for blurred vision.  Gastrointestinal: Positive for abdominal pain (incisional pain).  Neurological: Positive for headaches.  All other systems reviewed and are negative.   Physical Exam   Blood pressure 159/87, pulse 91, temperature 98.7 F (37.1 C), temperature source Oral, resp. rate 20, height 5\' 5"  (1.651 m), weight 120.022 kg (264 lb 9.6 oz), SpO2 98.00%, currently breastfeeding.  Physical Exam  Constitutional: She is oriented to person, place, and time. She appears well-developed and well-nourished. No distress.  HENT:  Head: Normocephalic.  Neck: Normal range of motion. Neck supple.  Cardiovascular: Normal rate, regular rhythm and normal heart sounds.   Respiratory: Effort normal and breath sounds normal.  GI: Soft. There is no tenderness. There is no guarding.  Incision site healing well; well approximated, no signs of infection; no palpable mass.  Genitourinary: There is bleeding (  scant) around the vagina.  Musculoskeletal: Normal range of motion. She exhibits edema (2+ bilat).  Neurological: She is alert and oriented to person, place, and time. She has normal reflexes.  Skin: Skin is warm and dry.    MAU Course  Procedures  Results for orders placed during the hospital encounter of 08/17/12 (from the past 24 hour(s))  CBC     Status: Abnormal   Collection Time    08/17/12  9:30 PM      Result Value Range   WBC 14.1 (*) 4.0 - 10.5 K/uL   RBC 3.80 (*) 3.87 - 5.11 MIL/uL   Hemoglobin 11.0 (*) 12.0 - 15.0 g/dL   HCT 40.9 (*) 81.1 - 91.4 %   MCV 87.4  78.0 -  100.0 fL   MCH 28.9  26.0 - 34.0 pg   MCHC 33.1  30.0 - 36.0 g/dL   RDW 78.2  95.6 - 21.3 %   Platelets 334  150 - 400 K/uL  COMPREHENSIVE METABOLIC PANEL     Status: Abnormal   Collection Time    08/17/12  9:30 PM      Result Value Range   Sodium 140  135 - 145 mEq/L   Potassium 3.8  3.5 - 5.1 mEq/L   Chloride 102  96 - 112 mEq/L   CO2 27  19 - 32 mEq/L   Glucose, Bld 87  70 - 99 mg/dL   BUN 12  6 - 23 mg/dL   Creatinine, Ser 0.86  0.50 - 1.10 mg/dL   Calcium 8.9  8.4 - 57.8 mg/dL   Total Protein 6.5  6.0 - 8.3 g/dL   Albumin 2.5 (*) 3.5 - 5.2 g/dL   AST 64 (*) 0 - 37 U/L   ALT 35  0 - 35 U/L   Alkaline Phosphatase 108  39 - 117 U/L   Total Bilirubin 0.3  0.3 - 1.2 mg/dL   GFR calc non Af Amer 77 (*) >90 mL/min   GFR calc Af Amer 90 (*) >90 mL/min  URIC ACID     Status: None   Collection Time    08/17/12  9:30 PM      Result Value Range   Uric Acid, Serum 5.8  2.4 - 7.0 mg/dL  LACTATE DEHYDROGENASE     Status: None   Collection Time    08/17/12  9:30 PM      Result Value Range   LDH 171  94 - 250 U/L   Consulted with Dr. Tenny Craw > admit patient for postpartum magnesium sulfate.    Assessment and Plan  Postpartum Preeclampsia  Plan: Admit to hospital IV Magnesium Sulfate   [facility] 08/17/2012, 9:45 PM

## 2012-08-17 NOTE — H&P (Signed)
Emma George is a 41 y.o. female presenting for Severe headache   42 year old G3 P21.  Post operative day number four status post a repeat low transverse cesarean section and bilateral tubal ligation at [redacted] weeks gestation for severe gestational hypertension. The patient was delivered for severe gestational hypertension on Monday. At that time all preeclampsia labs were normal. She was discharged home yesterday without any antihypertensive medications.  This evening, she has noted worsening headache that she describes "as if her head is going to explode".  The patient has been taking dilaudid For pain control.  She states she took her pain medicine this evening however it didn't touch her headache. Upon evaluation and maternity admissions the patient was noted to have elevated blood pressures  160/100, and  bloodwork showed normal platelets, but now elevated liver enzymes. Therefore, the patient will be admitted for postpartum magnesium sulfate for seizure prophylaxis, IV labetalol, and pain control. History OB History   Grav Para Term Preterm Abortions TAB SAB Ect Mult Living   3 2 2  0 1 0 1 0 0 2     Past Medical History  Diagnosis Date  . PCOS (polycystic ovarian syndrome)   . Gestational diabetes requiring insulin   . GERD (gastroesophageal reflux disease)   . Chronic kidney disease     Kidney Stones   Past Surgical History  Procedure Laterality Date  . Cesarean section    . Dilation and curettage of uterus    . Cholecysectomy    . Knee surgery    . Cesarean section Bilateral 08/13/2012    Procedure: Repeat cesarean section with delivery of baby boy at  2242. Apgars 9/9.  Bilateral tubal ligation with filshie clips.;  Surgeon: Freddrick March. Tenny Craw, MD;  Location: [facility];  Service: Obstetrics;  Laterality: Bilateral;   Family History: family history includes Cancer in her maternal grandmother and paternal grandmother; Diabetes in her father, paternal grandfather, and paternal grandmother;  Heart disease in her father, paternal grandfather, and paternal grandmother; Hyperlipidemia in her father; Hypertension in her mother; and Stroke in her maternal grandfather. Social History:  reports that she quit smoking about 8 years ago. Her smoking use included Cigarettes. She has a 15 pack-year smoking history. She does not have any smokeless tobacco history on file. She reports that she does not drink alcohol or use illicit drugs.  ROS:  As above    Blood pressure 158/88, pulse 95, temperature 98.7 F (37.1 C), temperature source Oral, resp. rate 20, height 5\' 5"  (1.651 m), weight 120.022 kg (264 lb 9.6 oz), SpO2 98.00%, currently breastfeeding. Exam Physical Exam  Prenatal labs: ABO, Rh: --/--/A POS (03/10 1712) Antibody: NEG (03/10 1712) Rubella:   RPR: NON REACTIVE (03/10 1712)  HBsAg:    HIV:    GBS:     Assessment/Plan: 1)  Admit 2)  IV magnesium sulfate 6 g load then 2 g Q1 hour for 24 hours. 3)  Labetalol 10 mg IV to 10 minutes PRN systolic blood pressure is 160 or greater or diastolic blood pressure is 100 or greater. 4)  SCDs for DVT prophylaxis 5)  Dilaudid for pain control 6) Repeat comprehensive metabolic panel in the morning.  Toney Lizaola H. 08/17/2012, 10:37 PM

## 2012-08-17 NOTE — MAU Note (Signed)
Had repeat C/S Monday due to elevated B/Ps. Went home yesterday. Today have bad h/a like my head is going to explode

## 2012-08-18 LAB — CBC
HCT: 30.6 % — ABNORMAL LOW (ref 36.0–46.0)
MCH: 29 pg (ref 26.0–34.0)
MCV: 87.9 fL (ref 78.0–100.0)
Platelets: 272 10*3/uL (ref 150–400)
RDW: 14.3 % (ref 11.5–15.5)

## 2012-08-18 LAB — COMPREHENSIVE METABOLIC PANEL
AST: 56 U/L — ABNORMAL HIGH (ref 0–37)
Albumin: 2.4 g/dL — ABNORMAL LOW (ref 3.5–5.2)
BUN: 13 mg/dL (ref 6–23)
CO2: 27 mEq/L (ref 19–32)
Calcium: 8.4 mg/dL (ref 8.4–10.5)
Creatinine, Ser: 0.8 mg/dL (ref 0.50–1.10)
GFR calc non Af Amer: >90 mL/min (ref >90)
Total Bilirubin: 0.3 mg/dL (ref 0.3–1.2)

## 2012-08-18 LAB — MRSA PCR SCREENING: MRSA by PCR: POSITIVE — AB

## 2012-08-18 MED ORDER — HYDROMORPHONE HCL 2 MG PO TABS: 1.0000 mg | ORAL_TABLET | ORAL | Status: DC | PRN

## 2012-08-18 MED ORDER — HYDROMORPHONE HCL 2 MG PO TABS
1.0000 mg | ORAL_TABLET | ORAL | Status: DC | PRN
  Administered 2012-08-18 (×3): 1 mg via ORAL
  Filled 2012-08-18 (×4): qty 1

## 2012-08-18 MED ORDER — HYDROMORPHONE HCL 2 MG PO TABS: 2.0000 mg | ORAL_TABLET | ORAL | Status: DC | PRN

## 2012-08-18 MED ORDER — BUTALBITAL-APAP-CAFFEINE 50-325-40 MG PO TABS
2.0000 | ORAL_TABLET | ORAL | Status: DC | PRN
  Administered 2012-08-18: 2 via ORAL
  Filled 2012-08-18: qty 2

## 2012-08-18 NOTE — Progress Notes (Signed)
Patient ID: Emma George, female   DOB: Feb 27, 1971, 42 y.o.   MRN: [id number]  S: Pt c/o headache since 6 am.  Had a headache on admission but improved after labetalol.  Hasn't tried pain medication yet. Has had 2 episodes of chills but no fever and WBC count declined from 14 to 12. O:  Filed Vitals:   08/18/12 0506 08/18/12 0606 08/18/12 0703 08/18/12 0817  BP: 144/86 140/92 150/90 146/114  Pulse: 73 77 90 95  Temp:    97.7 F (36.5 C)  TempSrc:    Oral  Resp: 20 20 18 22   Height:      Weight:      SpO2:       AOX3 NAD Abd Soft, appropriatedly tender No C/C/E  CBC    Component Value Date/Time   WBC 12.2* 08/18/2012 0604   RBC 3.48* 08/18/2012 0604   HGB 10.1* 08/18/2012 0604   HCT 30.6* 08/18/2012 0604   PLT 272 08/18/2012 0604   MCV 87.9 08/18/2012 0604   MCH 29.0 08/18/2012 0604   MCHC 33.0 08/18/2012 0604   RDW 14.3 08/18/2012 0604    CMP     Component Value Date/Time   NA 140 08/18/2012 0604   K 3.9 08/18/2012 0604   CL 103 08/18/2012 0604   CO2 27 08/18/2012 0604   GLUCOSE 75 08/18/2012 0604   BUN 13 08/18/2012 0604   CREATININE 0.80 08/18/2012 0604   CREATININE 0.62 03/02/2010 1145   CALCIUM 8.4 08/18/2012 0604   PROT 6.2 08/18/2012 0604   ALBUMIN 2.4* 08/18/2012 0604   AST 56* 08/18/2012 0604   ALT 31 08/18/2012 0604   ALKPHOS 96 08/18/2012 0604   BILITOT 0.3 08/18/2012 0604   GFRNONAA >90 08/18/2012 0604   GFRAA >90 08/18/2012 0604    A/P 1) Continue magnesium for 24 hrs 2) Dilaudid prn pain control 3) Labetalol 200mg  BID and IV prn BP control

## 2012-08-18 NOTE — Progress Notes (Signed)
10 mg IV Labetalol given 

## 2012-08-18 NOTE — Progress Notes (Signed)
Transferred to Room 172, report given to Lynnda Child, RN

## 2012-08-18 NOTE — Progress Notes (Signed)
Tdap vaccine cancelled.  Pt states that she received it a few days ago after birth of child

## 2012-08-19 MED ORDER — CHLORHEXIDINE GLUCONATE CLOTH 2 % EX PADS: 6.0000 | MEDICATED_PAD | Freq: Every day | CUTANEOUS | Status: DC

## 2012-08-19 MED ORDER — ZOLPIDEM TARTRATE 5 MG PO TABS: 5.0000 mg | ORAL_TABLET | Freq: Every evening | ORAL | Status: DC | PRN

## 2012-08-19 MED ORDER — MUPIROCIN 2 % EX OINT: TOPICAL_OINTMENT | CUTANEOUS | Status: AC

## 2012-08-19 MED ORDER — LABETALOL HCL 200 MG PO TABS: 200.0000 mg | ORAL_TABLET | Freq: Two times a day (BID) | ORAL | Status: DC

## 2012-08-19 MED ORDER — MUPIROCIN 2 % EX OINT
TOPICAL_OINTMENT | Freq: Two times a day (BID) | CUTANEOUS | Status: DC
  Administered 2012-08-19 (×2): via NASAL
  Filled 2012-08-19: qty 22

## 2012-08-19 MED ORDER — BUTALBITAL-APAP-CAFFEINE 50-325-40 MG PO TABS: 2.0000 | ORAL_TABLET | ORAL | Status: DC | PRN

## 2012-08-19 NOTE — Progress Notes (Addendum)
Results for SHEMEKIA, PATANE (MRN [id number]) as of 08/19/2012 07:12  Ref. Range 08/18/2012 21:09  MRSA by PCR Latest Range: NEGATIVE  POSITIVE (A)   Called by lab positive test results for MRSA.

## 2012-08-19 NOTE — Discharge Summary (Signed)
Obstetric Discharge Summary Reason for Admission: Postpartum Pre-eclampsia Postpartum Procedures: IV magnesium Sulfate, IV labetalol, AICU monitoring Complications-Operative and Postpartum: PP preeclamsia Hemoglobin  Date Value Range Status  08/18/2012 10.1* 12.0 - 15.0 g/dL Final     HCT  Date Value Range Status  08/18/2012 30.6* 36.0 - 46.0 % Final    Physical Exam:  General: alert, cooperative and appears stated age 42: appropriate Uterine Fundus: firm Incision: healing well DVT Evaluation: No evidence of DVT seen on physical exam.  Discharge Diagnoses: Preelampsia  Discharge Information: Date: 08/19/2012 Activity: pelvic rest Diet: routine Medications: Fiorocet, Ambien, Labetalol Condition: improved Instructions: refer to practice specific booklet Discharge to: home Follow-up Information   Follow up with CALLAHAN, SIDNEY, DO In 1 day. (keep previously scheduled appointment for a BP check)    Contact information:   [address] [place] [address] [phone]       Newborn Data: This patient has no babies on file. Home with mother.  Jahari Billy H. 08/19/2012, 11:42 AM

## 2012-08-19 NOTE — Progress Notes (Signed)
S: Pt now off magnesium.  Was having dull headache 4/10 that didn't respond to dilaudid.  Fiorocet started and now patient says she no longer has headache. Swelling persists but she feels much better.  Denies vision changes. O: Filed Vitals:   08/19/12 0700 08/19/12 0800 08/19/12 0830 08/19/12 1011  BP: 124/75 147/77  142/80  Pulse: 78 80  84  Temp:  98.4 F (36.9 C)    TempSrc:  Oral    Resp: 16  18 16   Height:      Weight: 120.884 kg (266 lb 8 oz) 118.888 kg (262 lb 1.6 oz)    SpO2: 95% 100%  98%   AOX3, NAD Obese, soft, NT/ND Incision healing well 2-3+ edema  A/P 1) BPs 130-140/80s.  No more severe range BPs.  Continue labetalol 200mg  BID.  D/C home.  Pt has appointment in office tomorrow for a BP check. 2) Fiorocet for headache 3) D/C home

## 2012-08-20 NOTE — Progress Notes (Signed)
Post discharge chart review completed.  

## 2012-08-21 ENCOUNTER — Inpatient Hospital Stay (HOSPITAL_COMMUNITY): Admission: AD | Admit: 2012-08-21 | Payer: Self-pay | Source: Ambulatory Visit | Admitting: Obstetrics and Gynecology

## 2012-08-21 SURGERY — Surgical Case
Anesthesia: Regional

## 2014-04-07 ENCOUNTER — Encounter (HOSPITAL_COMMUNITY): Payer: Self-pay | Admitting: Family

## 2014-09-16 IMAGING — US US OB FOLLOW-UP
1 series · 12 of 28 positions shown · non-contrast
Comparison: none

[Series 1: us ob follow-up · 0.25mm/px · 12 of 44 slices shown]
[im 2/44]
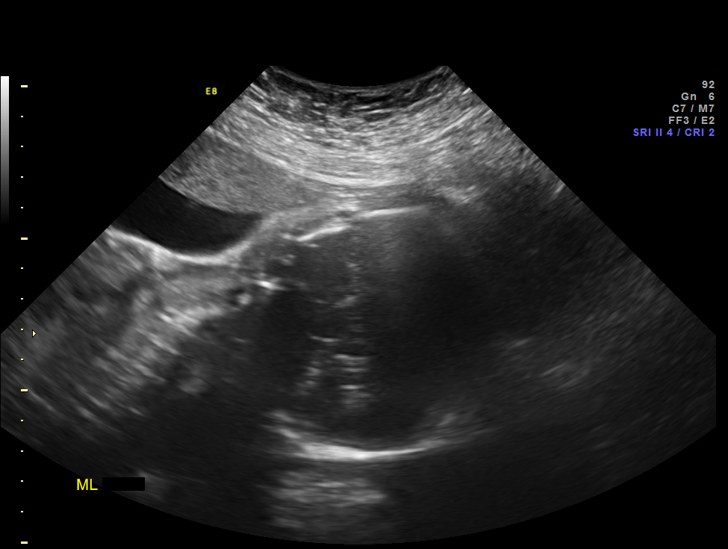
[im 5/44]
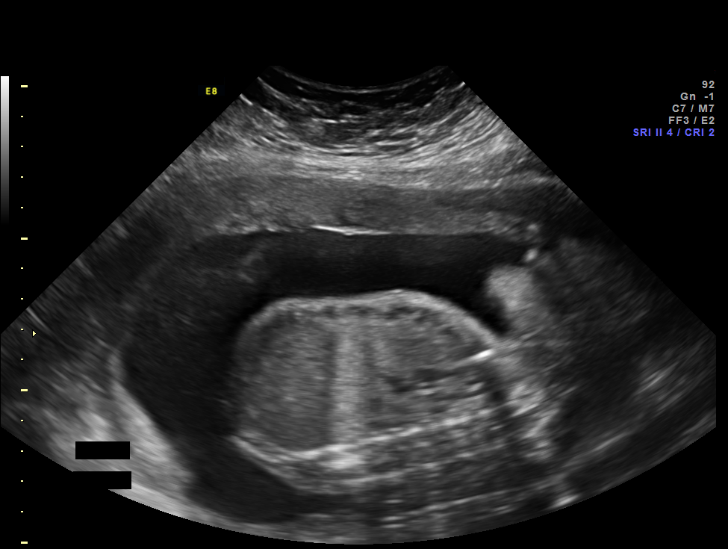
[im 8/44]
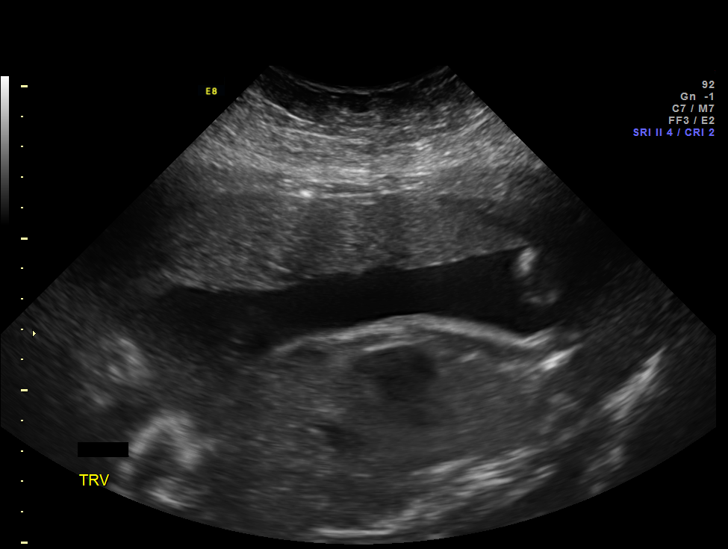
[im 13/44]
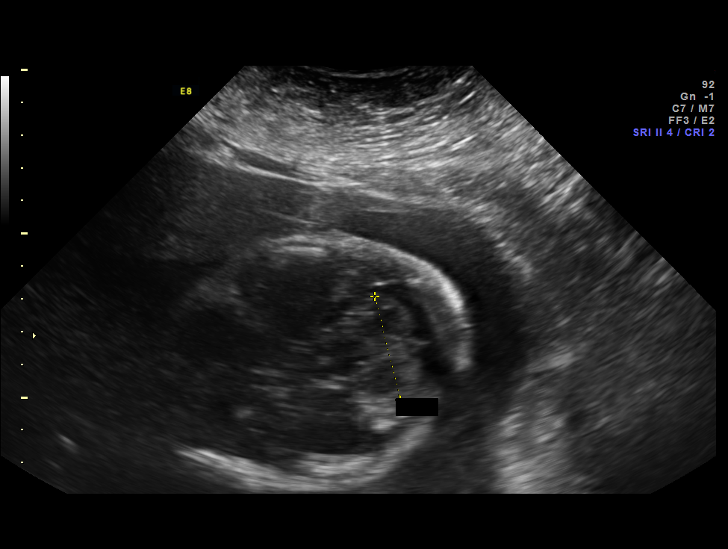
[im 16/44]
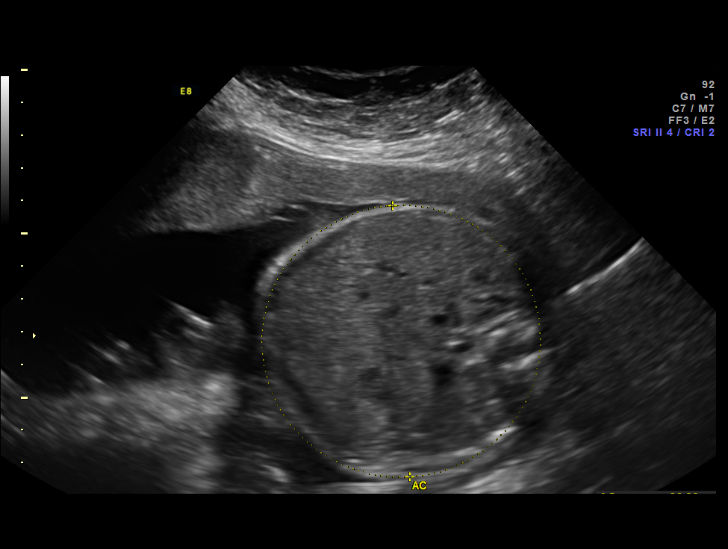
[im 20/44]
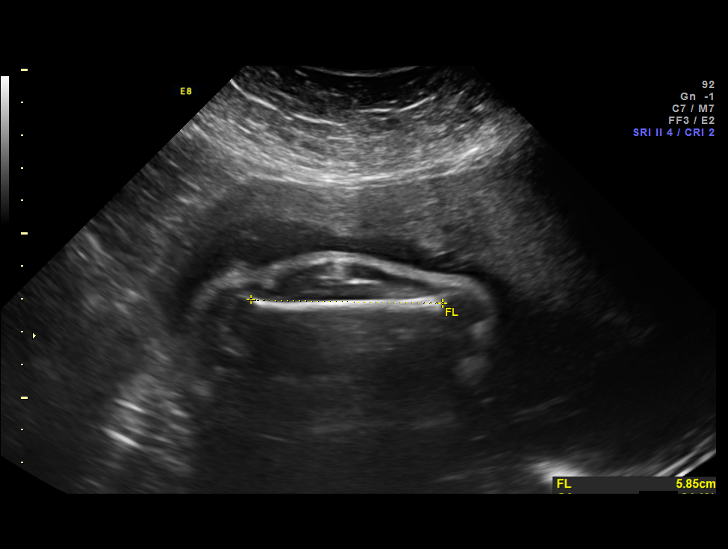
[im 24/44]
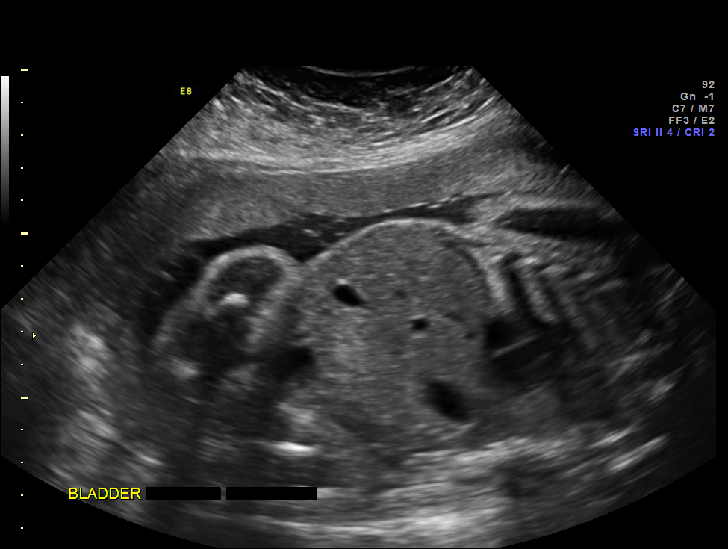
[im 28/44]
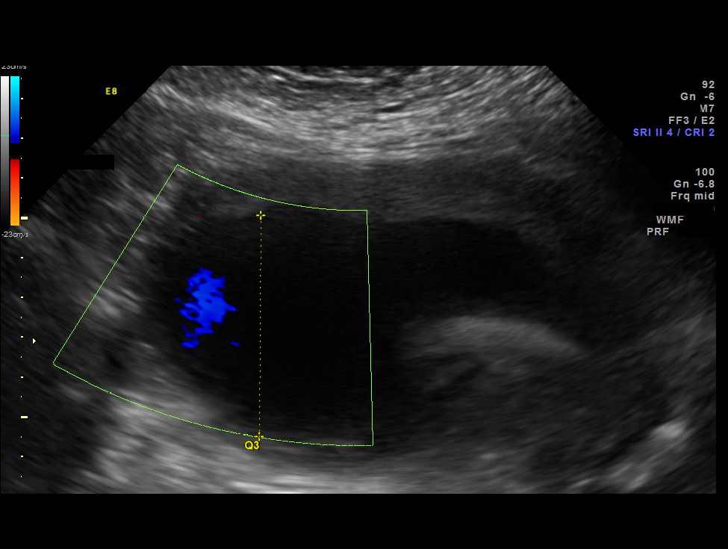
[im 31/44]
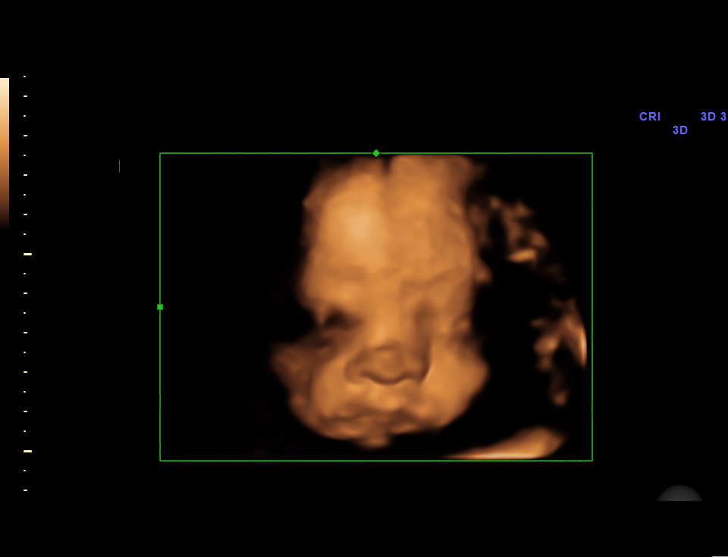
[im 36/44]
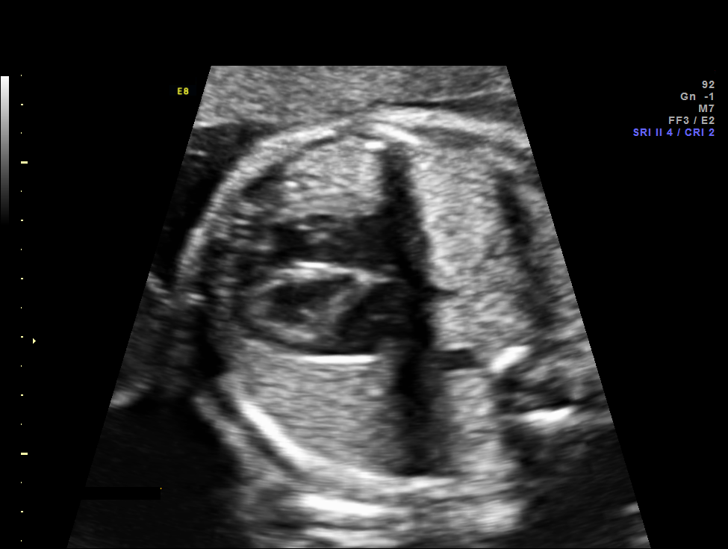
[im 39/44]
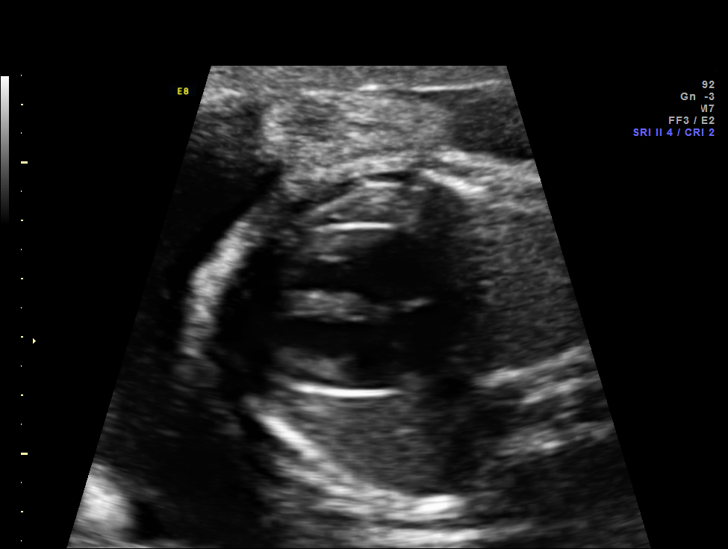
[im 42/44]
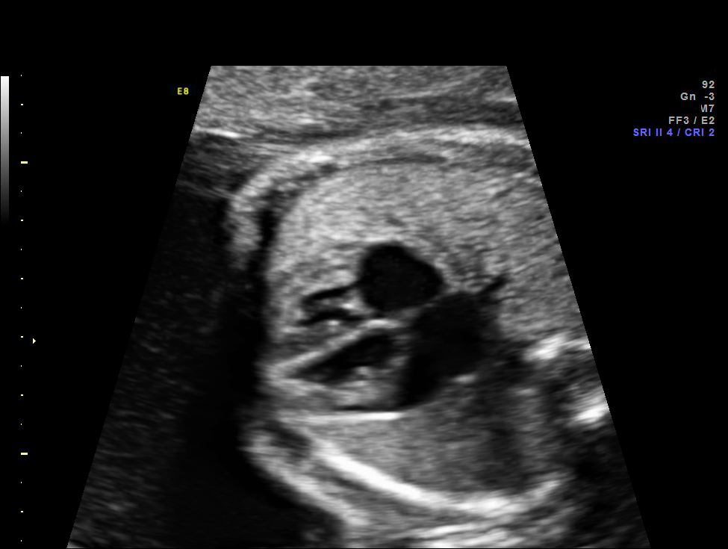

[12 of 28 positions shown; findings below may reference images not displayed]

OBSTETRICS REPORT
                      (Signed Final 06/04/2012 [DATE])

Service(s) Provided

 US OB FOLLOW UP                                       76816.1
Indications

 Diabetes - Pregestational (treated with glyburide)
 PCOS
 Uterine fibroid
 Obesity complicating pregnancy                        649.13,
Fetal Evaluation

 Num Of Fetuses:    1
 Fetal Heart Rate:  147                          bpm
 Cardiac Activity:  Observed
 Presentation:      Cephalic
 Placenta:          Anterior, above cervical os

 Amniotic Fluid
 AFI FV:      Subjectively upper-normal
 AFI Sum:     21.28   cm       87  %Tile     Larg Pckt:    5.56  cm
 RUQ:   5.49    cm   RLQ:    5.4    cm    LUQ:   4.83    cm   LLQ:    5.56   cm
Biometry

 BPD:     74.3  mm     G. Age:  29w 6d                CI:         73.3   70 - 86
 OFD:    101.4  mm                                    FL/HC:      20.0   18.8 -

 HC:     280.1  mm     G. Age:  30w 5d       91  %    HC/AC:      1.05   1.05 -

 AC:     266.9  mm     G. Age:  30w 6d     > 97  %    FL/BPD:     75.4   71 - 87
 FL:        56  mm     G. Age:  29w 4d       77  %    FL/AC:      21.0   20 - 24
 HUM:     47.9  mm     G. Age:  28w 0d       49  %
 CER:     33.9  mm     G. Age:  29w 3d       71  %

 Est. FW:    3363  gm      3 lb 6 oz     84  %
Gestational Age

 LMP:           29w 0d        Date:  11/14/11                 EDD:   08/20/12
 U/S Today:     30w 2d                                        EDD:   08/11/12
 Best:          28w 0d     Det. By:  Early Ultrasound         EDD:   08/27/12
                                     (01/10/12)
Anatomy

 Cranium:          Appears normal         Aortic Arch:      Previously seen
 Fetal Cavum:      Previously seen        Ductal Arch:      Previously seen
 Ventricles:       Appears normal         Diaphragm:        Appears normal
 Choroid Plexus:   Previously seen        Stomach:          Appears normal
 Cerebellum:       Appears normal         Abdomen:          Appears normal
 Posterior Fossa:  Previously seen        Abdominal Wall:   Previously seen
 Nuchal Fold:      Previously seen        Cord Vessels:     Previously seen
 Face:             Orbits and profile     Kidneys:          Appear normal
                   previously seen
 Lips:             Appears normal         Bladder:          Appears normal
 Heart:            Appears normal         Spine:            Previously seen
                   (4CH, axis, and
                   situs)
 RVOT:             Previously seen        Lower             Previously seen
                                          Extremities:
 LVOT:             Previously seen        Upper             Previously seen
                                          Extremities:

 Other:  Fetus appears to be a male. Heels and 5th digit previously visualized.
         Nasal bone previously visualized.
Impression

 Single IUP at 28 0/7 weeks
 Pregestational diabetes on Gylburide, normal fetal echo by
 Peds cardiology
 Normal interval anatomy
 The estimated fetal weight today is at the 84th %tile; The AC
 measures > 97th %tile
 Normal amniotic fluid volume
Recommendations

 Recommend follow up ultrasound for fetal growth in 4 weeks
 Antepartum fetal testing (2x weekly NSTs) beginning at 32
 weeks.

## 2014-11-14 IMAGING — US US OB FOLLOW-UP
1 series · 12 of 28 positions shown · non-contrast
Comparison: none

[Series 1: us ob follow-up · 12 of 38 slices shown]
[im 2/38]
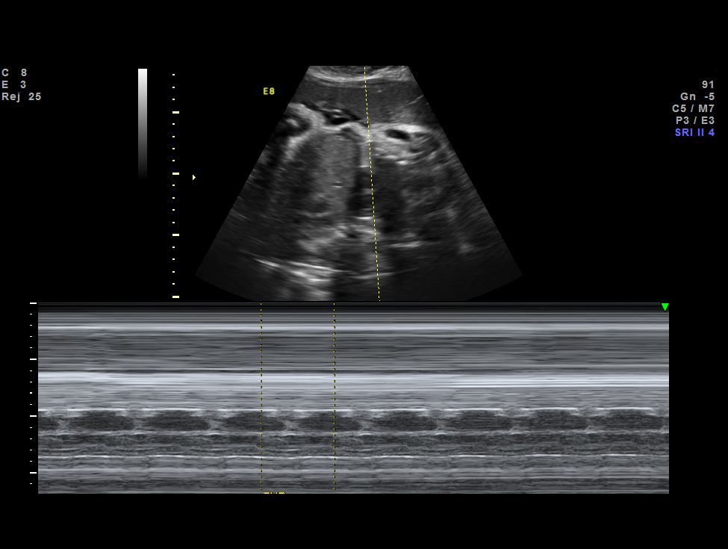
[im 5/38]
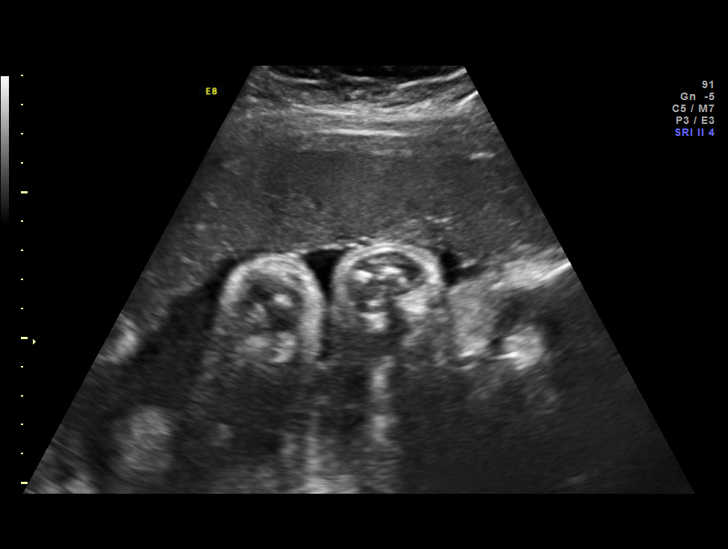
[im 7/38]
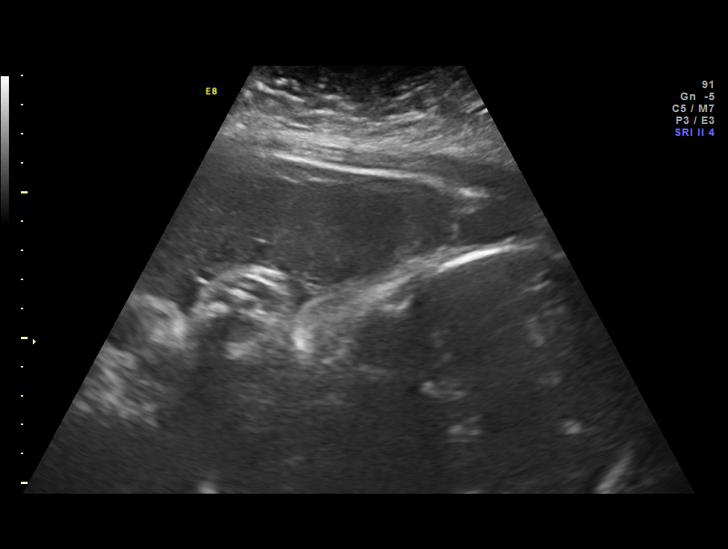
[im 11/38]
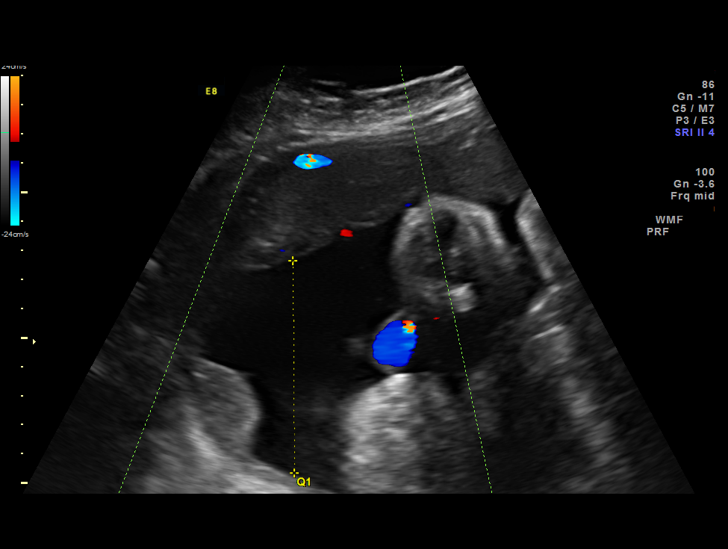
[im 14/38]
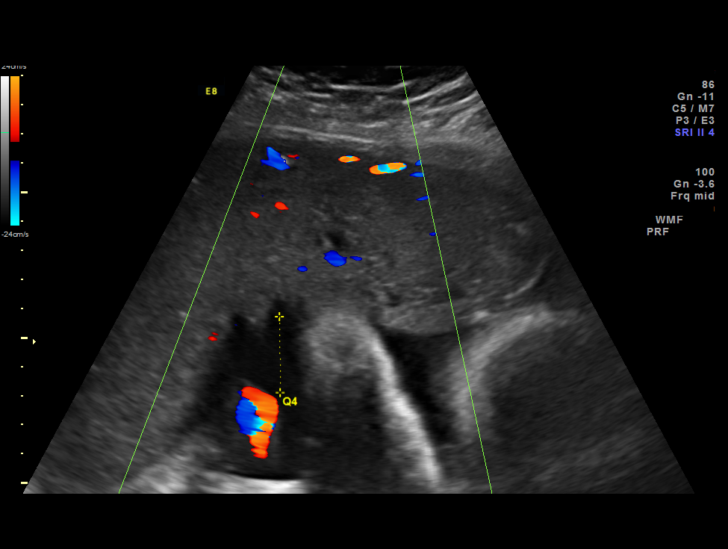
[im 17/38]
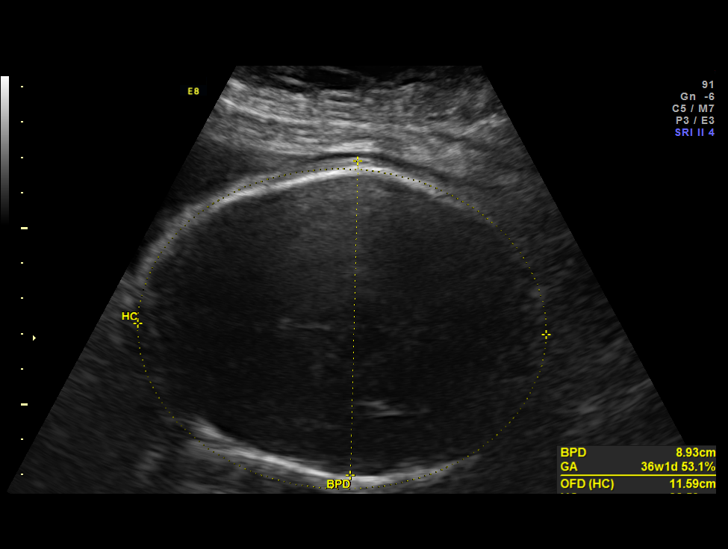
[im 21/38]
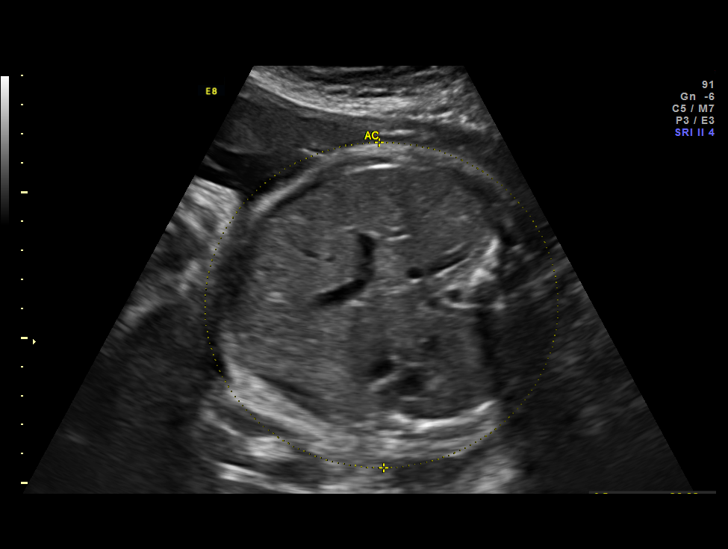
[im 24/38]
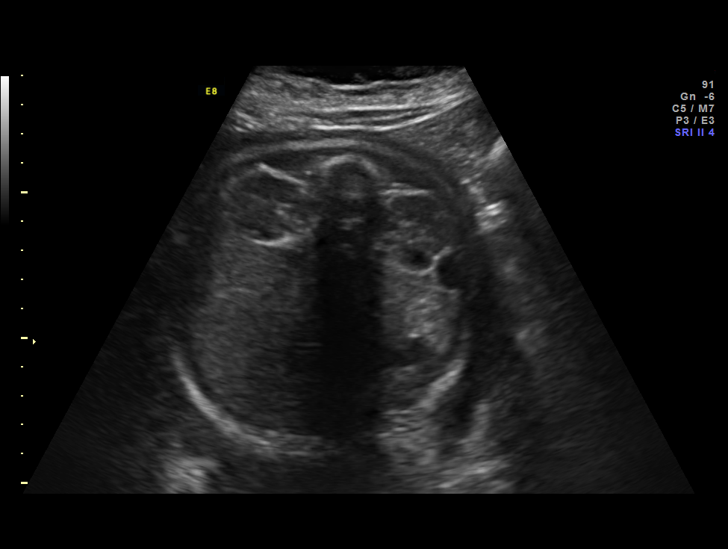
[im 27/38]
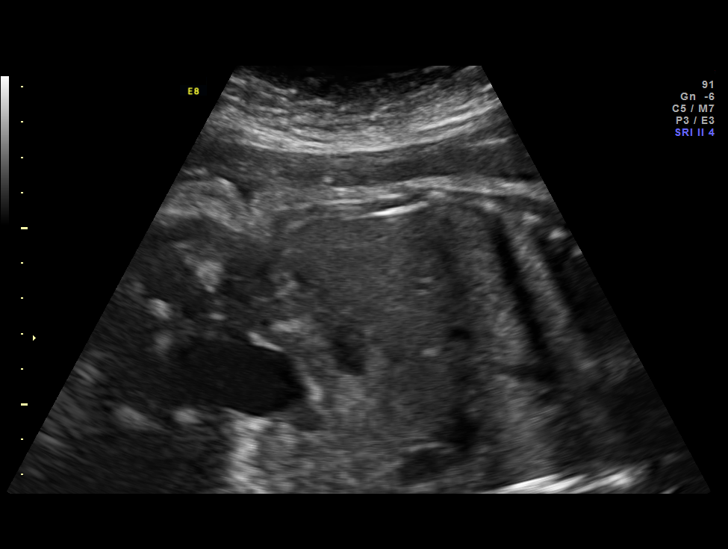
[im 31/38]
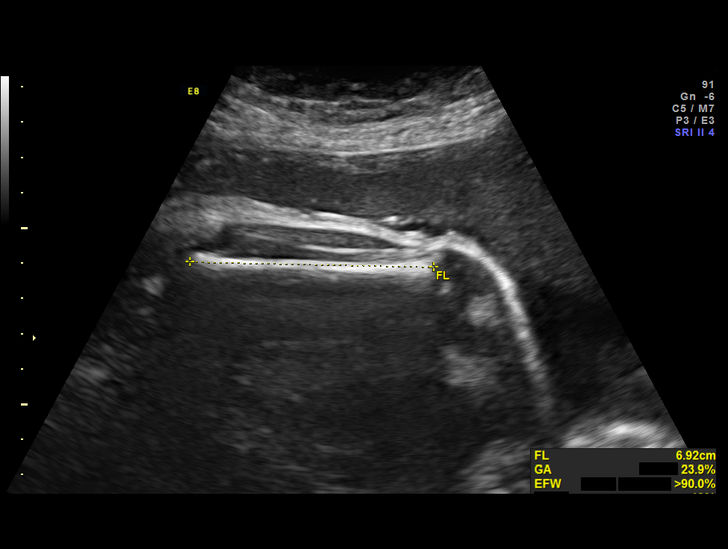
[im 33/38]
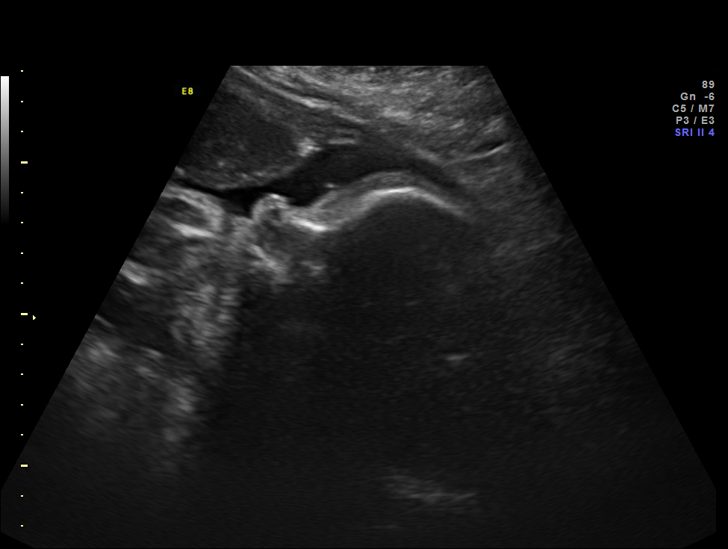
[im 36/38]
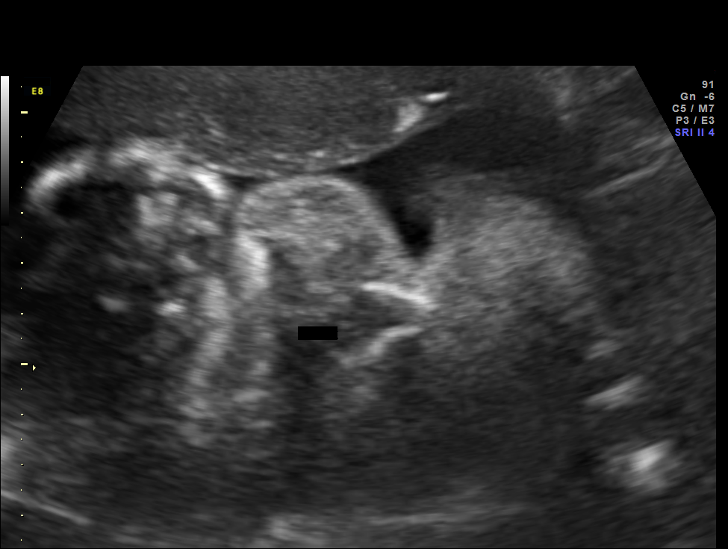

[12 of 28 positions shown; findings below may reference images not displayed]

OBSTETRICS REPORT
                      (Signed Final 08/02/2012 [DATE])

Service(s) Provided

 US OB FOLLOW UP                                       76816.1
Indications

 Diabetes - Pregestational (treated with insulin)
 PCOS
 Uterine fibroid
 Obesity complicating pregnancy                        649.13,
Fetal Evaluation

 Num Of Fetuses:    1
 Fetal Heart Rate:  138                          bpm
 Cardiac Activity:  Observed
 Presentation:      Cephalic
 Placenta:          Anterior, above cervical os
 P. Cord            Visualized, central
 Insertion:

 Amniotic Fluid
 AFI FV:      Subjectively within normal limits
 AFI Sum:     16.19   cm       60  %Tile     Larg Pckt:     7.3  cm
 RUQ:   7.3     cm   RLQ:    2.61   cm    LUQ:   4.1     cm   LLQ:    2.18   cm
Biometry

 BPD:     89.9  mm     G. Age:  36w 3d                CI:         74.7   70 - 86
 OFD:    120.3  mm                                    FL/HC:      21.0   20.1 -

 HC:     336.1  mm     G. Age:  38w 4d       72  %    HC/AC:      0.92   0.93 -

 AC:     366.8  mm     G. Age:  40w 4d     > 97  %    FL/BPD:     78.5   71 - 87
 FL:      70.6  mm     G. Age:  36w 1d       41  %    FL/AC:      19.2   20 - 24

 Est. FW:    1914  gm           8 lb   > 90  %
Gestational Age

 LMP:           37w 3d        Date:  11/14/11                 EDD:   08/20/12
 U/S Today:     37w 6d                                        EDD:   08/17/12
 Best:          36w 3d     Det. By:  Early Ultrasound         EDD:   08/27/12
                                     (01/10/12)
Anatomy
 Cranium:          Appears normal         Aortic Arch:      Previously seen
 Fetal Cavum:      Previously seen        Ductal Arch:      Previously seen
 Ventricles:       Appears normal         Diaphragm:        Appears normal
 Choroid Plexus:   Previously seen        Stomach:          Appears normal
 Cerebellum:       Previously seen        Abdomen:          Appears normal
 Posterior Fossa:  Previously seen        Abdominal Wall:   Previously seen
 Nuchal Fold:      Previously seen        Cord Vessels:     Previously seen
 Face:             Orbits and profile     Kidneys:          Appear normal
                   previously seen
 Lips:             Previously seen        Bladder:          Appears normal
 Heart:            Previously seen        Spine:            Previously seen
 RVOT:             Previously seen        Lower             Previously seen
                                          Extremities:
 LVOT:             Previously seen        Upper             Previously seen
                                          Extremities:

 Other:  Fetus appears to be a male. Heels and 5th digit previously visualized.
         Nasal bone previously visualized.
Cervix Uterus Adnexa

 Cervix:       Not visualized (advanced GA >34 wks)
Impression

 IUP at 36+3 weeks
 Normal interval anatomy; anatomic survey complete
 Normal amniotic fluid volume
 EFW > 90th %tile; AC > 97th %tile; fetus at risk to be
 LGA/macrosomic
Recommendations

 Continue twice weekly NSTs with weekly AFIs
 Follow-up as clinically indicated

## 2015-01-13 ENCOUNTER — Other Ambulatory Visit: Payer: Self-pay | Admitting: Obstetrics and Gynecology

## 2015-01-14 LAB — CYTOLOGY - PAP

## 2016-04-19 ENCOUNTER — Other Ambulatory Visit: Payer: Self-pay | Admitting: Obstetrics and Gynecology

## 2016-04-20 LAB — CYTOLOGY - PAP

## 2016-06-06 HISTORY — PX: GASTRIC BYPASS: SHX52

## 2016-06-15 ENCOUNTER — Encounter (HOSPITAL_COMMUNITY): Payer: Self-pay | Admitting: *Deleted

## 2016-06-24 NOTE — Patient Instructions (Addendum)
Your procedure is scheduled on:  Wednesday, Jan. 24, 2018  [facility]:  10:30 AM  Pick up the phone at the desk and dial [phone].  Call this number if you have problems the morning of surgery: [phone].  Remember: Do NOT eat food:  After Midnight Tuesday  Do NOT drink clear liquids after:  8:00 AM day of surgery  Take these medicines the morning of surgery with a SIP OF WATER:  None  Stop ALL herbal medications at this time  Do NOT smoke the day of surgery.  Do NOT wear jewelry (body piercing), metal hair clips/bobby pins, make-up, or nail polish. Do NOT wear lotions, powders, or perfumes.  You may wear deodorant. Do NOT shave for 48 hours prior to surgery. Do NOT bring valuables to the hospital. Contacts, dentures, or bridgework may not be worn into surgery.  Have a responsible adult drive you home and stay with you for 24 hours after your procedure  Bring a copy of your healthcare power of attorney and living will documents.  **Effective Friday, Jan. 12, 2018, [place] will implement no hospital visitations from children age 10812 and younger due to a steady increase in flu activity in our community and hospitals. **

## 2016-06-27 ENCOUNTER — Encounter (HOSPITAL_COMMUNITY)
Admission: RE | Admit: 2016-06-27 | Discharge: 2016-06-27 | Disposition: A | Discharge status: Home / Self Care | Payer: 59 | Source: Ambulatory Visit | Attending: Obstetrics and Gynecology | Admitting: Obstetrics and Gynecology

## 2016-06-27 ENCOUNTER — Encounter (HOSPITAL_COMMUNITY): Payer: Self-pay

## 2016-06-27 ENCOUNTER — Other Ambulatory Visit: Payer: Self-pay | Admitting: Obstetrics and Gynecology

## 2016-06-27 DIAGNOSIS — E282 Polycystic ovarian syndrome: Secondary | ICD-10-CM | POA: Diagnosis not present

## 2016-06-27 DIAGNOSIS — N920 Excessive and frequent menstruation with regular cycle: Secondary | ICD-10-CM

## 2016-06-27 DIAGNOSIS — Z87891 Personal history of nicotine dependence: Secondary | ICD-10-CM | POA: Diagnosis not present

## 2016-06-27 DIAGNOSIS — N189 Chronic kidney disease, unspecified: Secondary | ICD-10-CM | POA: Diagnosis not present

## 2016-06-27 DIAGNOSIS — F419 Anxiety disorder, unspecified: Secondary | ICD-10-CM | POA: Diagnosis not present

## 2016-06-27 DIAGNOSIS — K219 Gastro-esophageal reflux disease without esophagitis: Secondary | ICD-10-CM | POA: Diagnosis not present

## 2016-06-27 DIAGNOSIS — F329 Major depressive disorder, single episode, unspecified: Secondary | ICD-10-CM | POA: Diagnosis not present

## 2016-06-27 DIAGNOSIS — E1122 Type 2 diabetes mellitus with diabetic chronic kidney disease: Secondary | ICD-10-CM | POA: Diagnosis not present

## 2016-06-27 DIAGNOSIS — I129 Hypertensive chronic kidney disease with stage 1 through stage 4 chronic kidney disease, or unspecified chronic kidney disease: Secondary | ICD-10-CM | POA: Diagnosis not present

## 2016-06-27 DIAGNOSIS — Z01812 Encounter for preprocedural laboratory examination: Secondary | ICD-10-CM | POA: Insufficient documentation

## 2016-06-27 HISTORY — DX: Depression, unspecified: F32.A

## 2016-06-27 HISTORY — DX: Personal history of urinary calculi: Z87.442

## 2016-06-27 HISTORY — DX: Unspecified pre-eclampsia, unspecified trimester: O14.90

## 2016-06-27 HISTORY — DX: Anxiety disorder, unspecified: F41.9

## 2016-06-27 HISTORY — DX: Major depressive disorder, single episode, unspecified: F32.9

## 2016-06-27 LAB — CBC
HCT: 41.7 % (ref 36.0–46.0)
HEMOGLOBIN: 13.7 g/dL (ref 12.0–15.0)
MCH: 29 pg (ref 26.0–34.0)
MCHC: 32.9 g/dL (ref 30.0–36.0)
MCV: 88.2 fL (ref 78.0–100.0)
PLATELETS: 347 10*3/uL (ref 150–400)
RBC: 4.73 MIL/uL (ref 3.87–5.11)
RDW: 13.7 % (ref 11.5–15.5)
WBC: 11.1 10*3/uL — ABNORMAL HIGH (ref 4.0–10.5)

## 2016-06-27 LAB — SURGICAL PCR SCREEN
MRSA, PCR: NEGATIVE
STAPHYLOCOCCUS AUREUS: NEGATIVE

## 2016-06-29 ENCOUNTER — Ambulatory Visit (HOSPITAL_COMMUNITY)
Admission: RE | Admit: 2016-06-29 | Discharge: 2016-06-29 | Disposition: A | Discharge status: Home / Self Care | Payer: 59 | Source: Ambulatory Visit | Attending: Obstetrics and Gynecology | Admitting: Obstetrics and Gynecology

## 2016-06-29 ENCOUNTER — Encounter (HOSPITAL_COMMUNITY): Payer: Self-pay | Admitting: Anesthesiology

## 2016-06-29 ENCOUNTER — Ambulatory Visit (HOSPITAL_COMMUNITY): Payer: 59 | Admitting: Certified Registered Nurse Anesthetist

## 2016-06-29 ENCOUNTER — Encounter (HOSPITAL_COMMUNITY)
Admission: RE | Disposition: A | Discharge status: Home / Self Care | Payer: Self-pay | Source: Ambulatory Visit | Attending: Obstetrics and Gynecology

## 2016-06-29 DIAGNOSIS — E282 Polycystic ovarian syndrome: Secondary | ICD-10-CM | POA: Insufficient documentation

## 2016-06-29 DIAGNOSIS — E1122 Type 2 diabetes mellitus with diabetic chronic kidney disease: Secondary | ICD-10-CM | POA: Insufficient documentation

## 2016-06-29 DIAGNOSIS — F329 Major depressive disorder, single episode, unspecified: Secondary | ICD-10-CM | POA: Insufficient documentation

## 2016-06-29 DIAGNOSIS — Z87891 Personal history of nicotine dependence: Secondary | ICD-10-CM | POA: Insufficient documentation

## 2016-06-29 DIAGNOSIS — K219 Gastro-esophageal reflux disease without esophagitis: Secondary | ICD-10-CM | POA: Insufficient documentation

## 2016-06-29 DIAGNOSIS — N189 Chronic kidney disease, unspecified: Secondary | ICD-10-CM | POA: Insufficient documentation

## 2016-06-29 DIAGNOSIS — I129 Hypertensive chronic kidney disease with stage 1 through stage 4 chronic kidney disease, or unspecified chronic kidney disease: Secondary | ICD-10-CM | POA: Insufficient documentation

## 2016-06-29 DIAGNOSIS — F419 Anxiety disorder, unspecified: Secondary | ICD-10-CM | POA: Insufficient documentation

## 2016-06-29 DIAGNOSIS — N920 Excessive and frequent menstruation with regular cycle: Secondary | ICD-10-CM | POA: Diagnosis not present

## 2016-06-29 HISTORY — PX: DILITATION & CURRETTAGE/HYSTROSCOPY WITH NOVASURE ABLATION: SHX5568

## 2016-06-29 LAB — TYPE AND SCREEN
ABO/RH(D): A POS
Antibody Screen: NEGATIVE

## 2016-06-29 LAB — PREGNANCY, URINE: PREG TEST UR: NEGATIVE

## 2016-06-29 SURGERY — DILATATION & CURETTAGE/HYSTEROSCOPY WITH NOVASURE ABLATION
Anesthesia: General | Site: Uterus

## 2016-06-29 MED ORDER — SODIUM CHLORIDE 0.9 % IR SOLN
Status: DC | PRN
  Administered 2016-06-29: 3000 mL

## 2016-06-29 MED ORDER — OXYCODONE-ACETAMINOPHEN 5-325 MG PO TABS: 1.0000 | ORAL_TABLET | ORAL | 0 refills | Status: AC | PRN

## 2016-06-29 MED ORDER — ONDANSETRON HCL 4 MG/2ML IJ SOLN
INTRAMUSCULAR | Status: DC | PRN
  Administered 2016-06-29: 4 mg via INTRAVENOUS

## 2016-06-29 MED ORDER — SCOPOLAMINE 1 MG/3DAYS TD PT72: 1.0000 | MEDICATED_PATCH | Freq: Once | TRANSDERMAL | Status: DC

## 2016-06-29 MED ORDER — ONDANSETRON HCL 4 MG/2ML IJ SOLN
INTRAMUSCULAR | Status: AC
  Filled 2016-06-29: qty 2

## 2016-06-29 MED ORDER — IBUPROFEN 600 MG PO TABS: 600.0000 mg | ORAL_TABLET | Freq: Four times a day (QID) | ORAL | 0 refills | Status: AC | PRN

## 2016-06-29 MED ORDER — HYDROMORPHONE HCL 1 MG/ML IJ SOLN: 0.2500 mg | INTRAMUSCULAR | Status: DC | PRN

## 2016-06-29 MED ORDER — MEPERIDINE HCL 25 MG/ML IJ SOLN: 6.2500 mg | INTRAMUSCULAR | Status: DC | PRN

## 2016-06-29 MED ORDER — LACTATED RINGERS IV SOLN
INTRAVENOUS | Status: DC
  Administered 2016-06-29: 13:00:00 via INTRAVENOUS
  Administered 2016-06-29: 125 mL/h via INTRAVENOUS

## 2016-06-29 MED ORDER — PROPOFOL 10 MG/ML IV BOLUS
INTRAVENOUS | Status: DC | PRN
Start: 2016-06-29 — End: 2016-06-29
  Administered 2016-06-29: 100 mg via INTRAVENOUS
  Administered 2016-06-29: 200 mg via INTRAVENOUS

## 2016-06-29 MED ORDER — SCOPOLAMINE 1 MG/3DAYS TD PT72
MEDICATED_PATCH | TRANSDERMAL | Status: AC
  Administered 2016-06-29: 1.5 mg via TRANSDERMAL
  Filled 2016-06-29: qty 1

## 2016-06-29 MED ORDER — KETOROLAC TROMETHAMINE 30 MG/ML IJ SOLN
INTRAMUSCULAR | Status: AC
  Filled 2016-06-29: qty 1

## 2016-06-29 MED ORDER — PROMETHAZINE HCL 25 MG/ML IJ SOLN: 6.2500 mg | INTRAMUSCULAR | Status: DC | PRN

## 2016-06-29 MED ORDER — PROPOFOL 10 MG/ML IV BOLUS
INTRAVENOUS | Status: AC
  Filled 2016-06-29: qty 20

## 2016-06-29 MED ORDER — DEXAMETHASONE SODIUM PHOSPHATE 4 MG/ML IJ SOLN
INTRAMUSCULAR | Status: AC
  Filled 2016-06-29: qty 1

## 2016-06-29 MED ORDER — FENTANYL CITRATE (PF) 100 MCG/2ML IJ SOLN
INTRAMUSCULAR | Status: DC | PRN
  Administered 2016-06-29: 100 ug via INTRAVENOUS

## 2016-06-29 MED ORDER — LIDOCAINE HCL 1 % IJ SOLN
INTRAMUSCULAR | Status: AC
  Filled 2016-06-29: qty 20

## 2016-06-29 MED ORDER — FENTANYL CITRATE (PF) 100 MCG/2ML IJ SOLN
INTRAMUSCULAR | Status: AC
  Filled 2016-06-29: qty 2

## 2016-06-29 MED ORDER — MIDAZOLAM HCL 2 MG/2ML IJ SOLN
INTRAMUSCULAR | Status: DC | PRN
  Administered 2016-06-29: 2 mg via INTRAVENOUS

## 2016-06-29 MED ORDER — LIDOCAINE HCL 1 % IJ SOLN
INTRAMUSCULAR | Status: DC | PRN
  Administered 2016-06-29: 10 mL

## 2016-06-29 MED ORDER — DEXAMETHASONE SODIUM PHOSPHATE 10 MG/ML IJ SOLN
INTRAMUSCULAR | Status: DC | PRN
  Administered 2016-06-29: 4 mg via INTRAVENOUS

## 2016-06-29 MED ORDER — LIDOCAINE HCL (CARDIAC) 20 MG/ML IV SOLN
INTRAVENOUS | Status: DC | PRN
  Administered 2016-06-29: 50 mg via INTRAVENOUS

## 2016-06-29 MED ORDER — LACTATED RINGERS IV SOLN: INTRAVENOUS | Status: DC

## 2016-06-29 MED ORDER — MIDAZOLAM HCL 2 MG/2ML IJ SOLN
INTRAMUSCULAR | Status: AC
  Filled 2016-06-29: qty 2

## 2016-06-29 SURGICAL SUPPLY — 19 items
ABLATOR ENDOMETRIAL BIPOLAR (ABLATOR) ×3 IMPLANT
CANISTER SUCT 3000ML (MISCELLANEOUS) ×3 IMPLANT
CATH ROBINSON RED A/P 16FR (CATHETERS) ×3 IMPLANT
CLOTH BEACON ORANGE TIMEOUT ST (SAFETY) ×3 IMPLANT
CONTAINER PREFILL 10% NBF 60ML (FORM) ×6 IMPLANT
DILATOR CANAL MILEX (MISCELLANEOUS) IMPLANT
ELECT REM PT RETURN 9FT ADLT (ELECTROSURGICAL)
ELECTRODE REM PT RTRN 9FT ADLT (ELECTROSURGICAL) IMPLANT
GLOVE BIO SURGEON STRL SZ7 (GLOVE) ×3 IMPLANT
GLOVE BIOGEL PI IND STRL 7.0 (GLOVE) ×1 IMPLANT
GLOVE BIOGEL PI INDICATOR 7.0 (GLOVE) ×2
GOWN STRL REUS W/TWL LRG LVL3 (GOWN DISPOSABLE) ×9 IMPLANT
PACK VAGINAL MINOR WOMEN LF (CUSTOM PROCEDURE TRAY) ×3 IMPLANT
PAD OB MATERNITY 4.3X12.25 (PERSONAL CARE ITEMS) ×3 IMPLANT
SUT VIC AB 2-0 CT2 27 (SUTURE) ×3 IMPLANT
TOWEL OR 17X24 6PK STRL BLUE (TOWEL DISPOSABLE) ×6 IMPLANT
TUBING AQUILEX INFLOW (TUBING) ×3 IMPLANT
TUBING AQUILEX OUTFLOW (TUBING) ×3 IMPLANT
WATER STERILE IRR 1000ML POUR (IV SOLUTION) ×3 IMPLANT

## 2016-06-29 NOTE — Op Note (Signed)
Pre-Operative Diagnosis: 1) Menorrhagia Postoperative Diagnosis: 1) Menorrhagia Procedure: Hysteroscopy, dilation and curettage, Novasure endometrial ablation Surgeon: Dr. Waynard ReedsKendra Osamah Schmader Assistant: None.  Operative Findings: Slightly retroverted uterus sounding to 10 cm. Cavity length 4.5 cm. Cavity width 2.7 cm, Cervical length 5.5 cm. Normal appearing endometrial cavity. Ablation power 67Watts, ablation time 2:00. Adequate ablation was noted with hysteroscopic visualization after the ablation was complete.  A cervical laceration to the anterior and posterior lips of the cervix occurred from the single toothed tenaculum pulling through the cervical tissue. These were each repaired with a 2-0 vicryl figure of 8 stitch. Hemostasis was noted from both sites. Fluid deficit 360 cc Specimen: Endometrial curettings EBL: 100cc   Emma George Is a 46 year old gravida 4 para 3013 who presents for definitive surgical management for Menorrhagia. Please see the patient's history and physical for complete details of the history. Management options were discussed with the patient. R/B/A reviewed. Following appropriate informed consent was taken to the operating room. The patient was appropriately identified during a time out procedure. General anesthesia was administered and the patient was placed in the dorsal lithotomy position. The patient was prepped and draped in the normal sterile fashion. A speculum was placed into the vagina, a single-tooth tenaculum was placed on the anterior lip of the cervix, and 10 cc of 1% lidocaine was administered in a paracervical fashion. The cervix was serially dilated with Hank dilators. The uterus sounded to 10 cm. The hysteroscope was introduced for the above findings. A sharp curettage was performed. THe novasure divide was deployed. Initially there was some difficulty getting the Novasure to pass through the internal cervical os. The cervical dilation was felt to be adequate. THe  Novasure device eventually passed easily through the internal cervical os once the cervix was deviated slightly to the patients right. It was during these attempts that the cervical lacerations from the single toothed tenaculum slipping through the cervical tissue occurred. The Novasure device was deployed and the endometrial ablation procedure was performed for 2:00. The device was removed and the endometrial cavity was assessed one final time. The hysteroscope was again removed and the tenaculum was removed from the cervix. THe cervical lacerations were repaired with 2-0 vicryl figure of 8 stitches. The speculum was removed and the surgical procedure was complete. All sponge, lap, and needle counts were correct.

## 2016-06-29 NOTE — Transfer of Care (Signed)
Immediate Anesthesia Transfer of Care Note  Patient: [facility] - Emma George  Procedure(s) Performed: Procedure(s): DILATATION & CURETTAGE/HYSTEROSCOPY WITH NOVASURE ABLATION (N/A)  Patient Location: PACU  Anesthesia Type:General  Level of Consciousness: awake, alert  and oriented  Airway & Oxygen Therapy: Patient Spontanous Breathing and Patient connected to nasal cannula oxygen  Post-op Assessment: Report given to RN and Post -op Vital signs reviewed and stable  Post vital signs: Reviewed and stable  Last Vitals:  Vitals:   06/29/16 1047  BP: (!) 143/94  Pulse: 78  Resp: 20  Temp: 37.1 C    Last Pain:  Vitals:   06/29/16 1047  TempSrc: Oral      Patients Stated Pain Goal: 3 (06/29/16 1047)  Complications: No apparent anesthesia complications

## 2016-06-29 NOTE — Anesthesia Procedure Notes (Signed)
Procedure Name: LMA Insertion Date/Time: 06/29/2016 12:05 PM Performed by: Cleda ClarksBROWDER, Emma Abee R Pre-anesthesia Checklist: Patient identified, Emergency Drugs available, Suction available and Patient being monitored Patient Re-evaluated:Patient Re-evaluated prior to inductionOxygen Delivery Method: Circle system utilized Preoxygenation: Pre-oxygenation with 100% oxygen Intubation Type: IV induction Ventilation: Mask ventilation without difficulty LMA: LMA inserted LMA Size: 4.0 Tube type: Oral Number of attempts: 1 Airway Equipment and Method: Bite block Placement Confirmation: positive ETCO2 and breath sounds checked- equal and bilateral Tube secured with: Tape Dental Injury: Teeth and Oropharynx as per pre-operative assessment

## 2016-06-29 NOTE — Anesthesia Preprocedure Evaluation (Addendum)
Anesthesia Evaluation  Patient identified by MRN, date of birth, ID band Patient awake    Reviewed: Allergy & Precautions, NPO status , Patient's Chart, lab work & pertinent test results  Airway Mallampati: I  TM Distance: >3 FB Neck ROM: Full    Dental  (+) Teeth Intact, Dental Advisory Given   Pulmonary former smoker,    breath sounds clear to auscultation       Cardiovascular hypertension, Pt. on home beta blockers  Rhythm:Regular Rate:Normal     Neuro/Psych PSYCHIATRIC DISORDERS Anxiety Depression negative neurological ROS     GI/Hepatic Neg liver ROS, GERD  ,  Endo/Other  diabetes  Renal/GU Renal disease  negative genitourinary   Musculoskeletal negative musculoskeletal ROS (+)   Abdominal   Peds negative pediatric ROS (+)  Hematology negative hematology ROS (+)   Anesthesia Other Findings   Reproductive/Obstetrics negative OB ROS                            Lab Results  Component Value Date   WBC 11.1 (H) 06/27/2016   HGB 13.7 06/27/2016   HCT 41.7 06/27/2016   MCV 88.2 06/27/2016   PLT 347 06/27/2016   Lab Results  Component Value Date   CREATININE 0.80 08/18/2012   BUN 13 08/18/2012   NA 140 08/18/2012   K 3.9 08/18/2012   CL 103 08/18/2012   CO2 27 08/18/2012   No results found for: INR, PROTIME   Anesthesia Physical Anesthesia Plan  ASA: III  Anesthesia Plan: General   Post-op Pain Management:    Induction: Intravenous  Airway Management Planned: LMA  Additional Equipment:   Intra-op Plan:   Post-operative Plan: Extubation in OR  Informed Consent: I have reviewed the patients History and Physical, chart, labs and discussed the procedure including the risks, benefits and alternatives for the proposed anesthesia with the patient or authorized representative who has indicated his/her understanding and acceptance.   Dental advisory given  Plan  Discussed with: CRNA  Anesthesia Plan Comments:         Anesthesia Quick Evaluation

## 2016-06-29 NOTE — Discharge Instructions (Signed)

## 2016-06-29 NOTE — H&P (Signed)
Emma George is a 46 y.o. female presenting for hysteroscopy, dilation and curettage, and endometrial ablation with Novasure  46 yo [phone] presents for surgical management of menorrhagia. She  Is s/p BTL for contraception with her last cesarean. She has been experiencing heavy, regular menses and does not desire hormonal manangement. Risks/benefits/alternatives of the medical versus surgical management were reviewed and the patient wishes to proceed with endometrial ablation OB History    Gravida Para Term Preterm AB Living   3 2 2  0 1 2   SAB TAB Ectopic Multiple Live Births   1 0 0 0 1     Past Medical History:  Diagnosis Date  . Anxiety   . Chronic kidney disease    Kidney Stones  . Depression   . GERD (gastroesophageal reflux disease)    with pregnancy  . Gestational diabetes requiring insulin   . History of kidney stones   . PCOS (polycystic ovarian syndrome)   . Pre-eclampsia    also post partum in ICU for 4 days   Past Surgical History:  Procedure Laterality Date  . CESAREAN SECTION    . CESAREAN SECTION Bilateral 08/13/2012   Procedure: Repeat cesarean section with delivery of baby boy at  2242. Apgars 9/9.  Bilateral tubal ligation with filshie clips.;  Surgeon: Freddrick MarchKendra H. Tenny Crawoss, MD;  Location: [facility];  Service: Obstetrics;  Laterality: Bilateral;  . cholecysectomy    . CHOLECYSTECTOMY    . DILATION AND CURETTAGE OF UTERUS    . KNEE SURGERY    . TUBAL LIGATION     Family History: family history includes Cancer in her maternal grandmother and paternal grandmother; Diabetes in her father, paternal grandfather, and paternal grandmother; Heart disease in her father, paternal grandfather, and paternal grandmother; Hyperlipidemia in her father; Hypertension in her mother; Stroke in her maternal grandfather. Social History:  reports that she quit smoking about 12 years ago. Her smoking use included Cigarettes. She has a 15.00 pack-year smoking history. She has never used  smokeless tobacco. She reports that she does not drink alcohol or use drugs.  ROS History   Blood pressure (!) 143/94, pulse 78, temperature 98.7 F (37.1 C), temperature source Oral, resp. rate 20, last menstrual period 06/01/2016, SpO2 98 %, currently breastfeeding. Exam Physical Exam   AOX3, NAD CTAB RRR Abd soft/NT  Results for orders placed or performed during the hospital encounter of 06/29/16 (from the past 24 hour(s))  Pregnancy, urine     Status: None   Collection Time: 06/29/16 10:30 AM  Result Value Ref Range   Preg Test, Ur NEGATIVE NEGATIVE  Type and screen     Status: None (Preliminary result)   Collection Time: 06/29/16 10:35 AM  Result Value Ref Range   ABO/RH(D) A POS    Antibody Screen PENDING    Sample Expiration 07/02/2016       Assessment/Plan: 1) Admit 2) Proceed with Hysteroscopy, dilation and curettage, and novasure endometrial ablation  Emma George H. 06/29/2016, 11:28 AM

## 2016-06-29 NOTE — Anesthesia Postprocedure Evaluation (Addendum)
Anesthesia Post Note  Patient: Ailene RavelCheyenne S Portales  Procedure(s) Performed: Procedure(s) (LRB): DILATATION & CURETTAGE/HYSTEROSCOPY WITH NOVASURE ABLATION (N/A)  Patient location during evaluation: PACU Anesthesia Type: General Level of consciousness: awake and alert Pain management: pain level controlled Vital Signs Assessment: post-procedure vital signs reviewed and stable Respiratory status: spontaneous breathing, nonlabored ventilation, respiratory function stable and patient connected to nasal cannula oxygen Cardiovascular status: blood pressure returned to baseline and stable Postop Assessment: no signs of nausea or vomiting Anesthetic complications: no        Last Vitals:  Vitals:   06/29/16 1415 06/29/16 1446  BP: (!) 132/92 (!) 146/84  Pulse: 72 68  Resp: 14 16  Temp: 37.1 C 36.9 C    Last Pain:  Vitals:   06/29/16 1446  TempSrc:   PainSc: 3    Pain Goal: Patients Stated Pain Goal: 3 (06/29/16 1415)               Shelton SilvasKevin D Nazanin Kinner

## 2016-06-30 ENCOUNTER — Encounter (HOSPITAL_COMMUNITY): Payer: Self-pay | Admitting: Obstetrics and Gynecology

## 2016-11-04 NOTE — Addendum Note (Signed)
Addendum  created 11/04/16 0931 by Shelton SilvasHollis, Milica Gully D, MD   Sign clinical note

## 2023-07-07 NOTE — Progress Notes (Signed)
  Subjective Patient ID: Emma George is a 53 y.o. female.  Chief Complaint  Patient presents with  . URI    Patient c/o Headache, Chest Congestion, Bodyaches x 2 days; Patient exposed to Flu    The following information was reviewed by members of the visit team:  Tobacco  Allergies  Meds  Problems  OB Status     Patient Active Problem List  Diagnosis  . Maternal pregestational diabetes, classes B through R   No current outpatient medications No Known Allergies   URI  This is a new problem. Episode onset: 2 days. The problem has been gradually worsening. There has been no fever. Associated symptoms include congestion, coughing and headaches. Pertinent negatives include no diarrhea, ear pain, nausea, rhinorrhea, sinus pain, sneezing, sore throat, swollen glands, vomiting or wheezing. Treatments tried: cough drops Coricidin. The treatment provided no relief.    Review of Systems  Constitutional:  Positive for chills and fatigue. Negative for fever.  HENT:  Positive for congestion. Negative for ear pain, rhinorrhea, sinus pain, sneezing and sore throat.   Respiratory:  Positive for cough and chest tightness. Negative for shortness of breath and wheezing.   Gastrointestinal:  Negative for diarrhea, nausea and vomiting.  Musculoskeletal:  Positive for myalgias.  Neurological:  Positive for headaches.  All other systems reviewed and are negative.   ObjectiveBlood pressure 110/68, pulse 74, temperature 98.9 F (37.2 C), resp. rate 18, height 1.664 m (5' 5.5), weight 68.5 kg (151 lb), SpO2 98%.  Physical Exam Vitals and nursing note reviewed.  Constitutional:      Appearance: Normal appearance. She is not ill-appearing.  HENT:     Right Ear: Tympanic membrane and ear canal normal.     Left Ear: Tympanic membrane and ear canal normal.     Mouth/Throat:     Pharynx: Oropharynx is clear.  Cardiovascular:     Rate and Rhythm: Normal rate and regular rhythm.  Pulmonary:      Effort: Pulmonary effort is normal.     Breath sounds: Normal breath sounds. No wheezing, rhonchi or rales.  Musculoskeletal:     Cervical back: Neck supple.  Skin:    General: Skin is warm and dry.  Neurological:     Mental Status: She is oriented to person, place, and time.    Recent Results (from the past 24 hours)  POC Rapid Influenza A & B Antigen   Collection Time: 07/07/23  9:31 AM  Result Value Ref Range   Flu A Positive (A) Negative   Flu B Negative Negative   Internal Control Acceptable    Kit/Device Lot # [id number]    Kit/Device Expiration Date 6/26     Assessment/Plan Diagnoses and all orders for this visit:  Exposure to the flu -     POC Rapid Influenza A & B Antigen  Influenza A -     oseltamivir (TAMIFLU) 75 mg capsule; Take 1 capsule (75 mg total) by mouth 2 (two) times a day for 5 days. -     predniSONE (DELTASONE) 5 mg tablet; Take 1 tablet (5 mg total) by mouth daily for 10 days. -     albuterol HFA (PROVENTIL HFA;VENTOLIN HFA;PROAIR HFA) 90 mcg/actuation inhaler; Inhale 2 puffs every 4 (four) hours as needed for wheezing or shortness of breath.  She is to RTC if symptoms last longer then 1 week or for any wheezing/SOB  Electronically signed: Inocente Fairly, PA-C 07/07/2023  9:34 AM

## 2024-04-15 ENCOUNTER — Ambulatory Visit (INDEPENDENT_AMBULATORY_CARE_PROVIDER_SITE_OTHER)

## 2024-04-15 ENCOUNTER — Encounter (INDEPENDENT_AMBULATORY_CARE_PROVIDER_SITE_OTHER): Payer: Self-pay

## 2024-04-15 VITALS — BP 120/84 | HR 89 | Temp 97.9°F | Wt 150.0 lb

## 2024-04-15 DIAGNOSIS — M26609 Unspecified temporomandibular joint disorder, unspecified side: Secondary | ICD-10-CM

## 2024-04-15 DIAGNOSIS — M26629 Arthralgia of temporomandibular joint, unspecified side: Secondary | ICD-10-CM

## 2024-04-15 DIAGNOSIS — R599 Enlarged lymph nodes, unspecified: Secondary | ICD-10-CM

## 2024-04-15 DIAGNOSIS — R591 Generalized enlarged lymph nodes: Secondary | ICD-10-CM

## 2024-04-15 DIAGNOSIS — K08539 Fractured dental restorative material, unspecified: Secondary | ICD-10-CM | POA: Diagnosis not present

## 2024-04-15 NOTE — Progress Notes (Signed)
 HPI:   Discussed the use of AI scribe software for clinical note transcription with the patient, who gave verbal consent to proceed.  History of Present Illness Emma George is a 53 year old female who presents with worsening jaw clenching and teeth cracking. She was referred by another doctor for evaluation of a swollen lymph node under the right jaw.  She has a history of temporomandibular joint disorder (TMJ) which has worsened significantly since the end of last year. The exacerbation was triggered by stress related to her daughter's knee injury, leading to increased clenching and grinding of her teeth. This has resulted in cracking of multiple teeth, including two crowns, and she is currently in the process of dental repairs. She uses a bite guard at night, but clenching persists during the day.  Over the past few months, she has noticed a swollen lymph node on her neck. An ultrasound was performed, and she was told the lymph node was not swollen to a concerning level and that it looked benign. Despite this, the lymph node has remained swollen for about three months. It is slightly sore to touch but not painful otherwise. No recent weight loss or cold symptoms.  She experiences daily headaches and significant jaw pain, describing her jaw as popping and feeling like 'Mr Incredible' due to masseter muscle hypertrophy. She uses ice packs at night for relief and has been prescribed muscle relaxers by her primary care physician to use at night, which she finds effective but cannot use during the day due to their sedative effects.  Her past medical history includes gastric bypass surgery in 2018, and she is currently in menopause, for which she takes hormone replacement therapy. She maintains her weight and takes all necessary vitamins post-surgery. She has not had any drastic changes in her health recently.    PMH/Meds/All/SocHx/FamHx/ROS: Past Medical History:  Diagnosis Date   Anxiety     Chronic kidney disease    Kidney Stones   Depression    GERD (gastroesophageal reflux disease)    with pregnancy   Gestational diabetes requiring insulin    History of kidney stones    PCOS (polycystic ovarian syndrome)    Pre-eclampsia    also post partum in ICU for 4 days   Past Surgical History:  Procedure Laterality Date   CESAREAN SECTION     CESAREAN SECTION Bilateral 08/13/2012   Procedure: Repeat cesarean section with delivery of baby boy at  2242. Apgars 9/9.  Bilateral tubal ligation with filshie clips.;  Surgeon: Marjorie DEL. Okey, MD;  Location: [facility];  Service: Obstetrics;  Laterality: Bilateral;   cholecysectomy     CHOLECYSTECTOMY     DILATION AND CURETTAGE OF UTERUS     DILITATION & CURRETTAGE/HYSTROSCOPY WITH NOVASURE ABLATION N/A 06/29/2016   Procedure: DILATATION & CURETTAGE/HYSTEROSCOPY WITH NOVASURE ABLATION;  Surgeon: Marjorie Okey, MD;  Location: [facility];  Service: Gynecology;  Laterality: N/A;   GASTRIC BYPASS  2018   KNEE SURGERY     TUBAL LIGATION     No family history of bleeding disorders, wound healing problems or difficulty with anesthesia.  Social Connections: Socially Integrated (12/03/2023)   Received from [facility]   Social Network    How would you rate your social network (family, work, friends)?: Good participation with social networks    Current Outpatient Medications:    baclofen (LIORESAL) 10 MG tablet, Take 10 mg by mouth., Disp: , Rfl:    calcium citrate (CALCITRATE - DOSED IN MG  ELEMENTAL CALCIUM) 950 (200 Ca) MG tablet, Take 950 mg by mouth., Disp: , Rfl:    estradiol (CLIMARA - DOSED IN MG/24 HR) 0.05 mg/24hr patch, Place 0.05 mg onto the skin once a week., Disp: , Rfl:    Multiple Vitamins-Minerals (MULTIVITAMIN WITH MINERALS) tablet, Take 1 tablet by mouth., Disp: , Rfl:    progesterone (PROMETRIUM) 100 MG capsule, Take 100 mg by mouth at bedtime., Disp: , Rfl:    spironolactone (ALDACTONE) 50 MG tablet, Take 50 mg by mouth daily.,  Disp: , Rfl:    thiamine (VITAMIN B-1) 100 MG tablet, Take 100 mg by mouth., Disp: , Rfl:    valACYclovir (VALTREX) 1000 MG tablet, Take 4,000 mg by mouth once., Disp: , Rfl:    Vitamin D, Ergocalciferol, (DRISDOL) 1.25 MG (50000 UNIT) CAPS capsule, Take 50,000 Units by mouth once a week., Disp: , Rfl:    escitalopram (LEXAPRO) 10 MG tablet, Take 10 mg by mouth daily. (Patient not taking: Reported on 04/15/2024), Disp: , Rfl:    ibuprofen  (ADVIL ,MOTRIN ) 600 MG tablet, Take 1 tablet (600 mg total) by mouth every 6 (six) hours as needed. (Patient not taking: Reported on 04/15/2024), Disp: 90 tablet, Rfl: 0   oxyCODONE -acetaminophen  (ROXICET) 5-325 MG tablet, Take 1-2 tablets by mouth every 4 (four) hours as needed for severe pain. (Patient not taking: Reported on 04/15/2024), Disp: 20 tablet, Rfl: 0 A complete ROS was performed with pertinent positives/negatives noted in the HPI. The remainder of the ROS are negative.   Physical Exam:  BP 120/84 (BP Location: Left Arm, Patient Position: Sitting, Cuff Size: Normal)   Pulse 89   Temp 97.9 F (36.6 C)   Wt 150 lb (68 kg)   SpO2 98%   BMI 24.58 kg/m  General: Well developed, well nourished. No acute distress.  Head/Face: Normocephalic. No sinus tenderness. Facial nerve intact and equal bilaterally. No facial lacerations. Masseter muscle hypertrophy bilaterally Eyes: PERRL, no scleral icterus or conjunctival hemorrhage. EOMI. Ears: No gross deformity. Normal external canal. Tympanic membrane in tact bilaterally Hearing: Normal speech reception.  Nose: No gross deformity or lesions. No purulent discharge. No turbinate hypertrophy. Mouth/Oropharynx: Lips without any lesions. Dentition fair. No mucosal lesions within the oropharynx. No tonsillar enlargement, exudate, or lesions. Pharyngeal walls symmetrical. Uvula midline. Tongue midline without lesions. Larynx: See TFL if applicable Nasopharynx: See TFL if applicable Neck: Trachea midline. No  masses. No thyromegaly or nodules palpated. No crepitus. Lymphatic: 1 cm lymph node palpated anterior to the right submandibular gland, soft, mobile, non-tender, no overlying skin changes Respiratory: No stridor or distress. Room air. Cardiovascular: Regular rate and rhythm. Extremities: No edema or cyanosis. Warm and well-perfused. Skin: No scars or lesions on face or neck. Neurologic: CN II-XII grossly intact. Moving all extremities without gross abnormality. 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 - Continue night muscle relaxers as needed. - Recommend soft foods - Recommend NSAIDs   Enlarged right cervical lymph node under observation Right cervical lymph node enlarged for three months, mobile, and soft. No significant changes or systemic symptoms. Likely related to dental issues.  - Order follow-up ultrasound of right cervical lymph node in four to six months.   Adah Malkin, DO [facility]

## 2024-04-17 ENCOUNTER — Telehealth (INDEPENDENT_AMBULATORY_CARE_PROVIDER_SITE_OTHER): Payer: Self-pay

## 2024-04-17 NOTE — Telephone Encounter (Signed)
 LVM for patient to call back to schedule in-office procedure with Dr. Mila.  (45 min  Botox Inj)

## 2024-05-06 ENCOUNTER — Ambulatory Visit (INDEPENDENT_AMBULATORY_CARE_PROVIDER_SITE_OTHER)

## 2024-05-06 ENCOUNTER — Encounter (INDEPENDENT_AMBULATORY_CARE_PROVIDER_SITE_OTHER): Payer: Self-pay

## 2024-05-06 VITALS — BP 109/71 | HR 85 | Temp 98.0°F | Wt 150.0 lb

## 2024-05-06 DIAGNOSIS — R591 Generalized enlarged lymph nodes: Secondary | ICD-10-CM

## 2024-05-06 DIAGNOSIS — M26629 Arthralgia of temporomandibular joint, unspecified side: Secondary | ICD-10-CM

## 2024-05-06 DIAGNOSIS — M26623 Arthralgia of bilateral temporomandibular joint: Secondary | ICD-10-CM

## 2024-05-09 NOTE — Progress Notes (Signed)
 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 No new complaints today. Patient is having dental work this week to repair one of the cracked molars.   PMH/Meds/All/SocHx/FamHx/ROS: Past Medical History:  Diagnosis Date   Anxiety    Chronic kidney disease    Kidney Stones   Depression    GERD (gastroesophageal reflux disease)    with pregnancy   Gestational diabetes requiring insulin    History of kidney stones    PCOS (polycystic ovarian syndrome)    Pre-eclampsia    also post partum in ICU for 4 days   Past Surgical History:  Procedure Laterality Date   CESAREAN SECTION     CESAREAN SECTION Bilateral 08/13/2012   Procedure: Repeat cesarean section with delivery of baby boy at  2242. Apgars 9/9.  Bilateral tubal ligation with filshie clips.;  Surgeon: Marjorie DEL. Okey, MD;  Location: [facility];  Service: Obstetrics;  Laterality: Bilateral;   cholecysectomy     CHOLECYSTECTOMY     DILATION AND CURETTAGE OF UTERUS     DILITATION & CURRETTAGE/HYSTROSCOPY WITH NOVASURE ABLATION N/A 06/29/2016   Procedure: DILATATION & CURETTAGE/HYSTEROSCOPY WITH NOVASURE ABLATION;  Surgeon: Marjorie Okey, MD;  Location: [facility];  Service: Gynecology;  Laterality: N/A;   GASTRIC BYPASS  2018   KNEE SURGERY     TUBAL LIGATION     No family history of bleeding disorders, wound healing problems or difficulty with anesthesia.  Social Connections: Socially Integrated (12/03/2023)   Received from [facility]   Social Network    How would you rate your social network (family, work, friends)?: Good participation with social networks    Current Outpatient Medications:    baclofen (LIORESAL) 10 MG tablet, Take 10 mg by mouth., Disp: , Rfl:    calcium citrate (CALCITRATE - DOSED IN MG ELEMENTAL CALCIUM) 950 (200 Ca) MG tablet, Take 950 mg by mouth., Disp: , Rfl:    estradiol (CLIMARA - DOSED IN MG/24 HR) 0.05  mg/24hr patch, Place 0.05 mg onto the skin once a week., Disp: , Rfl:    Multiple Vitamins-Minerals (MULTIVITAMIN WITH MINERALS) tablet, Take 1 tablet by mouth., Disp: , Rfl:    progesterone (PROMETRIUM) 100 MG capsule, Take 100 mg by mouth at bedtime., Disp: , Rfl:    spironolactone (ALDACTONE) 50 MG tablet, Take 50 mg by mouth daily., Disp: , Rfl:    thiamine (VITAMIN B-1) 100 MG tablet, Take 100 mg by mouth., Disp: , Rfl:    valACYclovir (VALTREX) 1000 MG tablet, Take 4,000 mg by mouth once., Disp: , Rfl:    Vitamin D, Ergocalciferol, (DRISDOL) 1.25 MG (50000 UNIT) CAPS capsule, Take 50,000 Units by mouth once a week., Disp: , Rfl:    escitalopram (LEXAPRO) 10 MG tablet, Take 10 mg by mouth daily. (Patient not taking: Reported on 05/06/2024), Disp: , Rfl:    ibuprofen  (ADVIL ,MOTRIN ) 600 MG tablet, Take 1 tablet (600 mg total) by mouth every 6 (six) hours as needed. (Patient not taking: Reported on 05/06/2024), Disp: 90 tablet, Rfl: 0   oxyCODONE -acetaminophen  (ROXICET) 5-325 MG tablet, Take 1-2 tablets by mouth every 4 (four) hours as needed for severe pain. (Patient not taking: Reported on 05/06/2024), Disp: 20 tablet, Rfl: 0 A complete ROS was performed with pertinent positives/negatives noted in the HPI. The remainder of the ROS are negative.   Physical Exam:  BP 109/71 (BP Location: Left Arm, Patient Position: Sitting, Cuff Size:  Normal)   Pulse 85   Temp 98 F (36.7 C) (Oral)   Wt 150 lb (68 kg)   SpO2 98%   BMI 24.58 kg/m  General: Well developed, well nourished. No acute distress.  Head/Face: Normocephalic. No sinus tenderness. Facial nerve intact and equal bilaterally. No facial lacerations. Masseter muscle hypertrophy bilaterally Eyes: PERRL, no scleral icterus or conjunctival hemorrhage. EOMI. Ears: No gross deformity. Normal external canal. Tympanic membrane in tact bilaterally Hearing: Normal speech reception.  Nose: No gross deformity or lesions. No purulent discharge. No  turbinate hypertrophy. Mouth/Oropharynx: Lips without any lesions. Dentition - cracked maxillary molars bilaterally. No mucosal lesions within the oropharynx. No tonsillar enlargement, exudate, or lesions. Pharyngeal walls symmetrical. Uvula midline. Tongue midline without lesions. Larynx: See TFL if applicable Nasopharynx: See TFL if applicable Neck: Trachea midline. No masses. No thyromegaly or nodules palpated. No crepitus. Lymphatic: 1 cm lymph node palpated anterior to the right submandibular gland, soft, mobile, non-tender, no overlying skin changes - stable since last exam Respiratory: No stridor or distress. Room air. Cardiovascular: Regular rate and rhythm. Extremities: No edema or cyanosis. Warm and well-perfused. Skin: No scars or lesions on face or neck. Neurologic: CN II-XII grossly intact. Moving all extremities without gross abnormality. Other:  Independent Review of Additional Tests or Records: None Procedures:  Masseter Muscle Injection Procedure Note Date of Procedure: 05/06/2024 Preoperative Diagnosis: TMJ dysfunction Postoperative Diagnosis: same Procedure(s):  Chemo-denervation bilateral masseter muscles (CPT [phone])  Indications for Procedure: 53 yo female presents with severe bruxism and TMJ dysfunction leading to cracked maxillary molars bilaterally. Patient has broken several bit guards and is currently working with a dental team. All risks, benefits, and alternatives discussed with patient prior to procedure. Written consent was signed and placed on the chart.  Estimated Blood Loss:  none Drains and/or Packs: No drains or packs Significant Events: No significant adverse events Procedure Details:  Patient was placed in a seated position. The landmarks of the masseter muscles were identified by palpation with clenched jaw. The skin overlying the injection sites was cleansed with alcohol. A total of 100 units of Botox was prepared at a concentration of 40  U/mL.  Using a 30-gauge needle, injections were placed bilaterally into the masseter muscles at 4-5 injection points per side, staying within the lower two-thirds of the muscle belly to avoid spread to adjacent facial musculature.  Injected dose: Right masseter: 40 units Left masseter: 40 units  Hemostasis achieved with gentle pressure. No significant bleeding occurred.  The patient tolerated the procedure well without immediate complications. Complications: none Post-Op Condition of Patient: Tolerated procedure well  Avoid massaging or manipulating the injected areas for 24 hours. Avoid strenuous exercise for 4-6 hours.  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 - Continue night muscle relaxers as needed. - Recommend soft foods - Recommend NSAIDs   Enlarged right cervical lymph node under observation Right cervical lymph node unchanged from last exam, mobile, and soft. No significant changes or systemic symptoms. - Order follow-up ultrasound of right cervical lymph node in four to six months.   Follow-up appointment scheduled for April  Emma Malkin, DO [facility]

## 2024-09-30 ENCOUNTER — Ambulatory Visit (INDEPENDENT_AMBULATORY_CARE_PROVIDER_SITE_OTHER)
